# Patient Record
Sex: Female | Born: 1960 | Race: White | Hispanic: No
Health system: Southern US, Academic
[De-identification: ages and names within clinical notes are randomized; demographics above are authoritative.]

## PROBLEM LIST (undated history)

## (undated) ENCOUNTER — Ambulatory Visit

## (undated) ENCOUNTER — Ambulatory Visit: Payer: PRIVATE HEALTH INSURANCE | Attending: Internal Medicine | Primary: Internal Medicine

## (undated) ENCOUNTER — Encounter: Attending: Internal Medicine | Primary: Internal Medicine

## (undated) ENCOUNTER — Encounter

## (undated) ENCOUNTER — Ambulatory Visit: Payer: PRIVATE HEALTH INSURANCE | Attending: Adult Health | Primary: Adult Health

## (undated) ENCOUNTER — Telehealth

## (undated) ENCOUNTER — Ambulatory Visit
Attending: Pharmacist Clinician (PhC)/ Clinical Pharmacy Specialist | Primary: Pharmacist Clinician (PhC)/ Clinical Pharmacy Specialist

## (undated) ENCOUNTER — Ambulatory Visit: Payer: PRIVATE HEALTH INSURANCE

## (undated) ENCOUNTER — Ambulatory Visit: Attending: Addiction (Substance Use Disorder) | Primary: Addiction (Substance Use Disorder)

## (undated) ENCOUNTER — Ambulatory Visit: Attending: Internal Medicine | Primary: Internal Medicine

## (undated) ENCOUNTER — Ambulatory Visit: Attending: Pharmacist | Primary: Pharmacist

## (undated) ENCOUNTER — Telehealth: Attending: Internal Medicine | Primary: Internal Medicine

## (undated) ENCOUNTER — Ambulatory Visit: Payer: MEDICARE | Attending: Internal Medicine | Primary: Internal Medicine

## (undated) ENCOUNTER — Encounter: Payer: PRIVATE HEALTH INSURANCE | Attending: Retina Specialist | Primary: Retina Specialist

## (undated) ENCOUNTER — Ambulatory Visit: Payer: MEDICAID | Attending: Internal Medicine | Primary: Internal Medicine

## (undated) ENCOUNTER — Encounter: Attending: Adult Health | Primary: Adult Health

## (undated) ENCOUNTER — Telehealth: Attending: Clinical | Primary: Clinical

## (undated) ENCOUNTER — Encounter: Attending: Addiction (Substance Use Disorder) | Primary: Addiction (Substance Use Disorder)

## (undated) ENCOUNTER — Ambulatory Visit
Payer: PRIVATE HEALTH INSURANCE | Attending: Student in an Organized Health Care Education/Training Program | Primary: Student in an Organized Health Care Education/Training Program

## (undated) ENCOUNTER — Ambulatory Visit: Attending: Mental Health | Primary: Mental Health

## (undated) ENCOUNTER — Telehealth: Attending: Family | Primary: Family

## (undated) ENCOUNTER — Encounter
Attending: Student in an Organized Health Care Education/Training Program | Primary: Student in an Organized Health Care Education/Training Program

## (undated) ENCOUNTER — Encounter: Payer: PRIVATE HEALTH INSURANCE | Attending: Adult Health | Primary: Adult Health

## (undated) ENCOUNTER — Ambulatory Visit: Payer: MEDICAID

## (undated) ENCOUNTER — Other Ambulatory Visit: Attending: Addiction (Substance Use Disorder) | Primary: Addiction (Substance Use Disorder)

## (undated) ENCOUNTER — Ambulatory Visit: Payer: MEDICARE

## (undated) ENCOUNTER — Ambulatory Visit: Payer: BLUE CROSS/BLUE SHIELD | Attending: Internal Medicine | Primary: Internal Medicine

## (undated) DIAGNOSIS — I10 Essential (primary) hypertension: Secondary | ICD-10-CM

## (undated) DIAGNOSIS — K7460 Unspecified cirrhosis of liver: Secondary | ICD-10-CM

## (undated) DIAGNOSIS — E119 Type 2 diabetes mellitus without complications: Secondary | ICD-10-CM

## (undated) DIAGNOSIS — B182 Chronic viral hepatitis C: Secondary | ICD-10-CM

## (undated) DIAGNOSIS — I509 Heart failure, unspecified: Secondary | ICD-10-CM

## (undated) DIAGNOSIS — F419 Anxiety disorder, unspecified: Secondary | ICD-10-CM

## (undated) HISTORY — PX: HAND SURGERY: SHX662

## (undated) HISTORY — PX: APPENDECTOMY: SHX54
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
## 2007-06-27 ENCOUNTER — Emergency Department: Payer: Self-pay | Admitting: Emergency Medicine

## 2008-05-04 ENCOUNTER — Emergency Department: Payer: Self-pay | Admitting: Emergency Medicine

## 2009-01-05 ENCOUNTER — Inpatient Hospital Stay: Payer: Self-pay | Admitting: Internal Medicine

## 2009-03-29 ENCOUNTER — Emergency Department: Payer: Self-pay | Admitting: Emergency Medicine

## 2009-03-31 ENCOUNTER — Emergency Department: Payer: Self-pay | Admitting: Emergency Medicine

## 2009-04-02 ENCOUNTER — Emergency Department: Payer: Self-pay | Admitting: Unknown Physician Specialty

## 2009-05-10 ENCOUNTER — Emergency Department: Payer: Self-pay | Admitting: Emergency Medicine

## 2010-01-21 ENCOUNTER — Observation Stay: Payer: Self-pay | Admitting: General Surgery

## 2010-03-07 ENCOUNTER — Emergency Department: Payer: Self-pay | Admitting: Emergency Medicine

## 2010-04-02 ENCOUNTER — Emergency Department: Payer: Self-pay | Admitting: Emergency Medicine

## 2010-04-16 ENCOUNTER — Emergency Department: Payer: Self-pay | Admitting: Unknown Physician Specialty

## 2010-04-19 ENCOUNTER — Emergency Department: Payer: Self-pay | Admitting: Internal Medicine

## 2010-05-25 IMAGING — CR DG CHEST 2V
1 series · 2 of 2 positions shown · non-contrast
Comparison: none

REASON FOR EXAM: sob
COMMENTS:

[Series 1: view not recorded · 0.17mm/px · 2 of 2 slices shown]
[im 1/2]
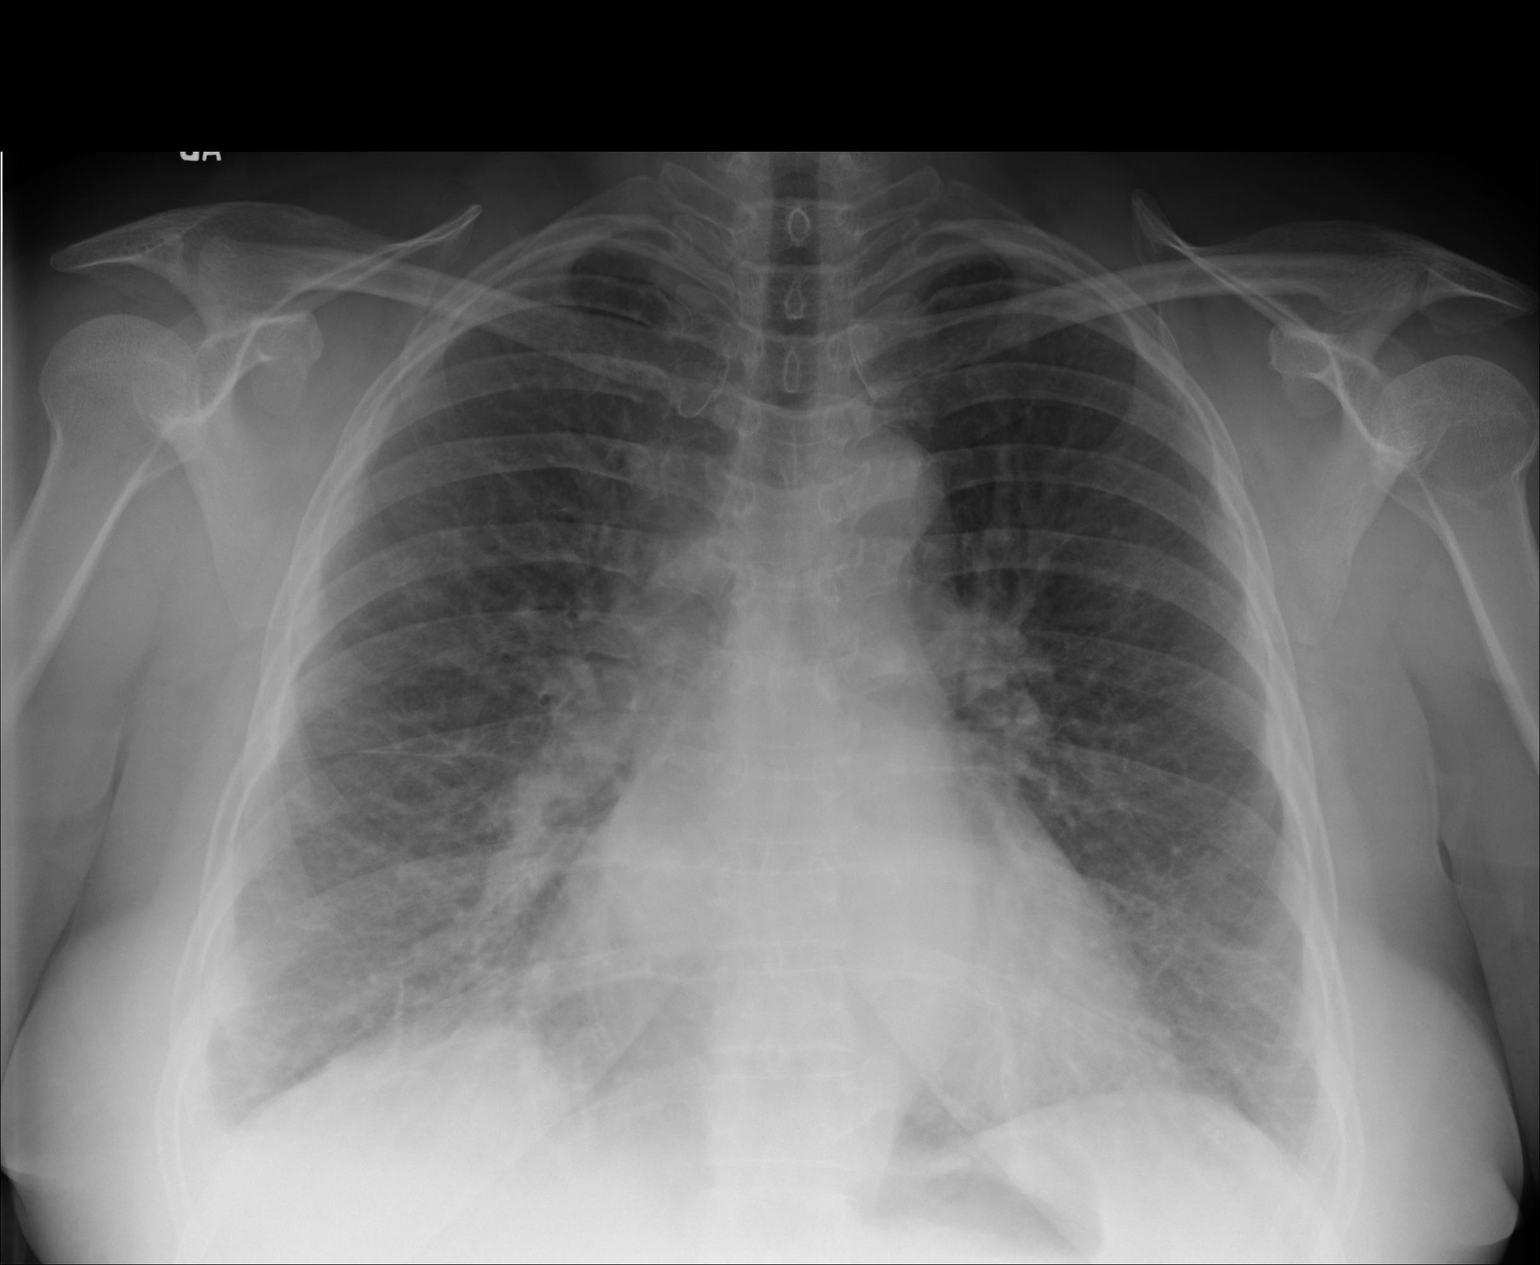
[im 2/2]
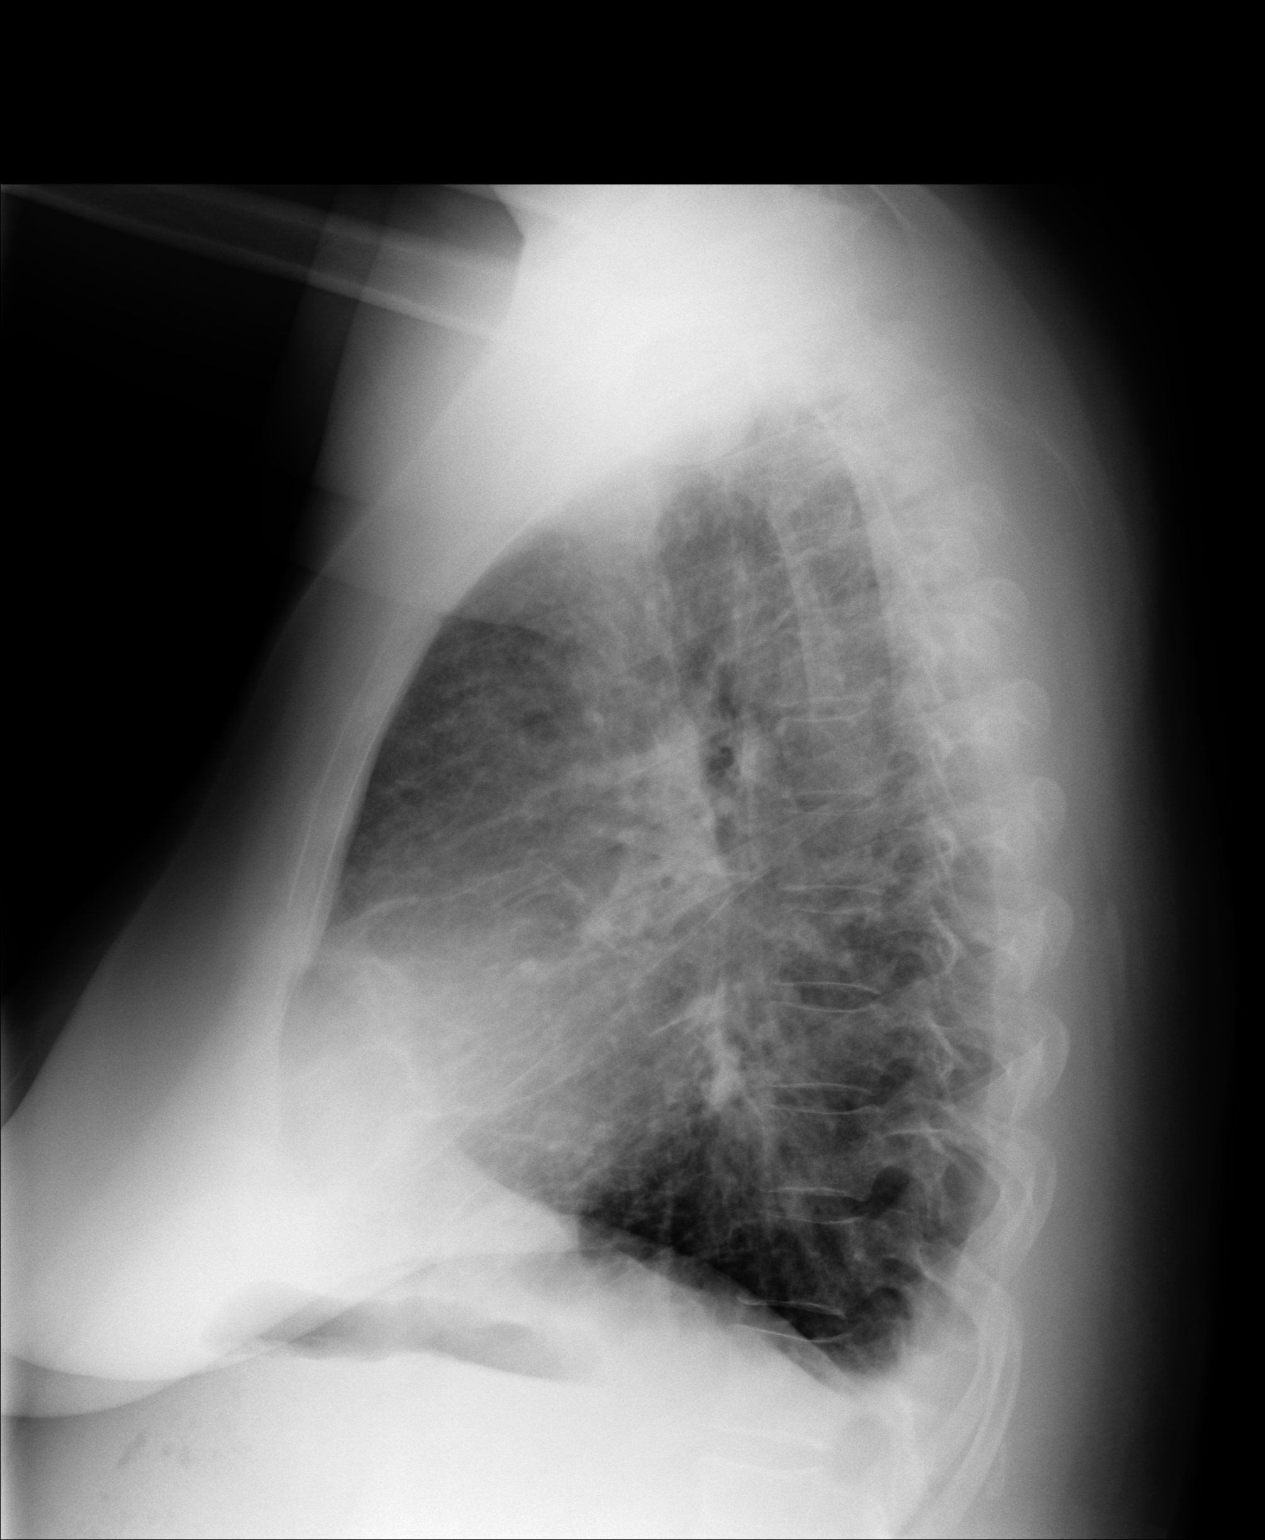

[2 of 2 positions shown; findings below may reference images not displayed]

PROCEDURE:     DXR - DXR CHEST PA (OR AP) AND LATERAL  - January 08, 2009  [DATE]

RESULT:     Comparison is made to the exam of 01/05/2009. There are trace
bilateral pleural effusions. The lungs are hyperinflated. There is pulmonary
vascular congestion. Mild pulmonary edema is not excluded. The heart is
mildly enlarged but stable.
IMPRESSION: COPD with mild cardiomegaly and trace bilateral pleural
effusions. Changes of pulmonary vascular congestion and either mild fibrosis
or mild pulmonary edema are noted.

## 2010-05-28 ENCOUNTER — Emergency Department: Payer: Self-pay | Admitting: Emergency Medicine

## 2010-07-18 ENCOUNTER — Emergency Department: Payer: Self-pay | Admitting: Emergency Medicine

## 2011-08-13 ENCOUNTER — Emergency Department: Payer: Self-pay | Admitting: Emergency Medicine

## 2011-08-17 IMAGING — CT CT HEAD WITHOUT CONTRAST
2 series · 16 of 30 positions shown, 20 images · non-contrast
Comparison: none

REASON FOR EXAM: headache
COMMENTS:

PROCEDURE:     CT  - CT HEAD WITHOUT CONTRAST  - April 02, 2010  [DATE]
RESULT:
HISTORY: Headache.
COMPARISON STUDY: No recent.
PROCEDURE AND FINDINGS: Standard nonenhanced Head CT was obtained. There is
no mass lesion. There is no hydrocephalus. No hemorrhage. No acute bony
abnormality.

[Series 2: without · axial · non-contrast · 0.41mm/px · z∈[-182,-62]mm · 13 of 29 slices shown, 17 images]
[im 3/29  brain]
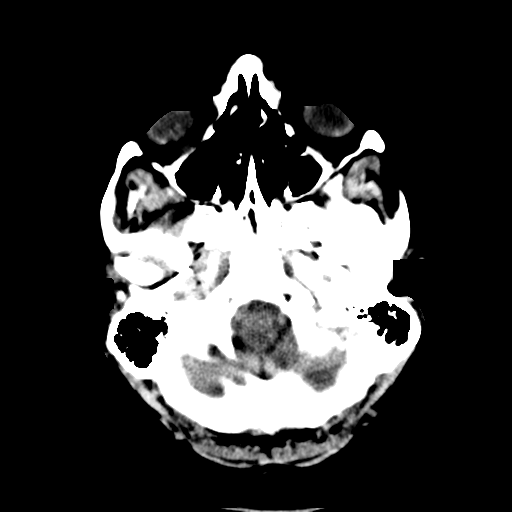
[im 3/29  bone]
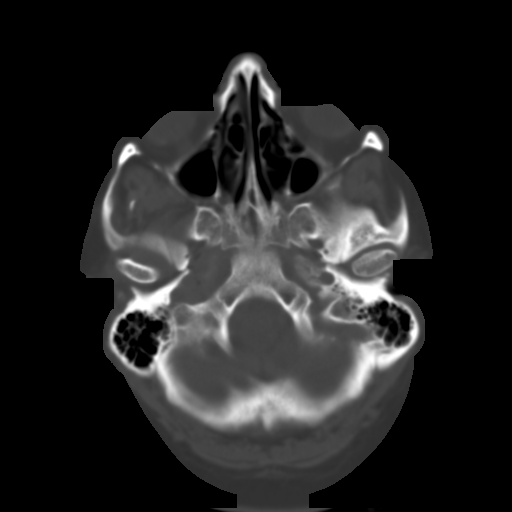
[im 5/29  brain]
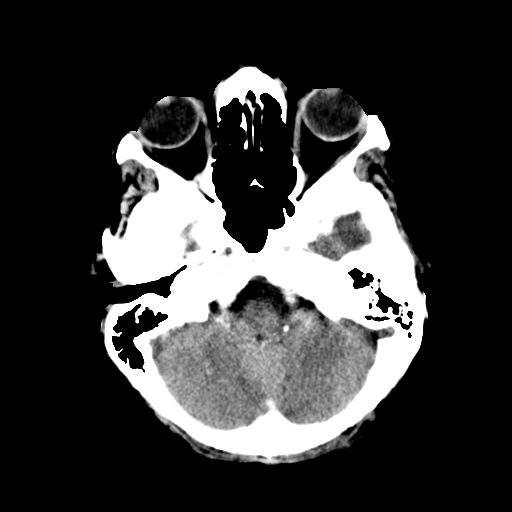
[im 7/29  brain]
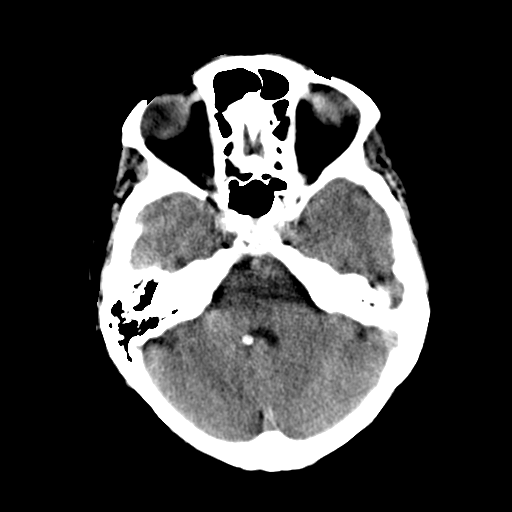
[im 9/29  brain]
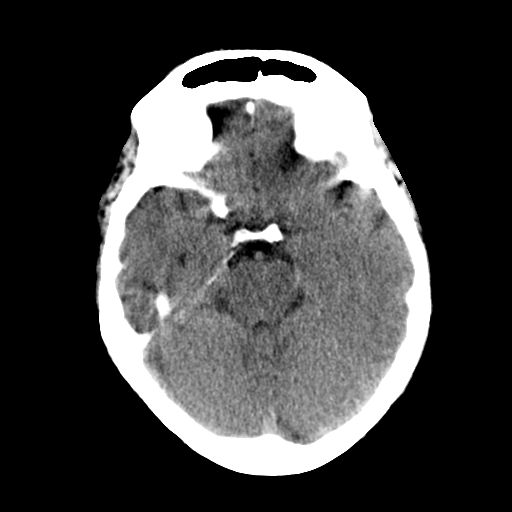
[im 11/29  brain]
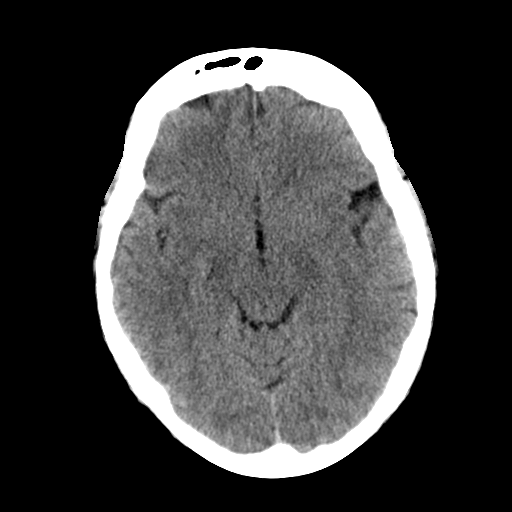
[im 11/29  bone]
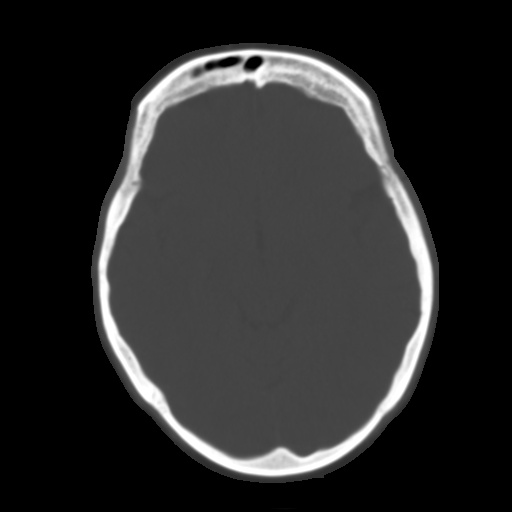
[im 13/29  brain]
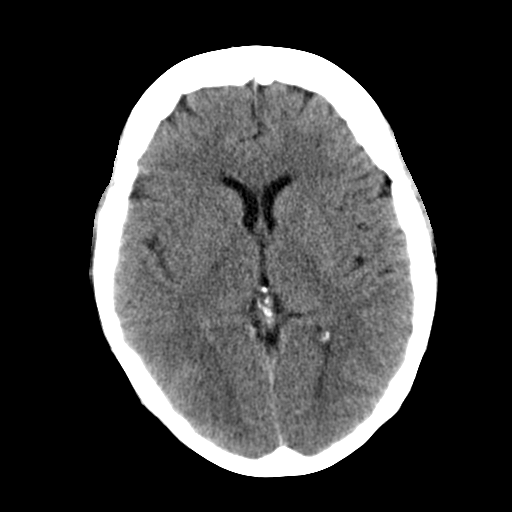
[im 15/29  brain]
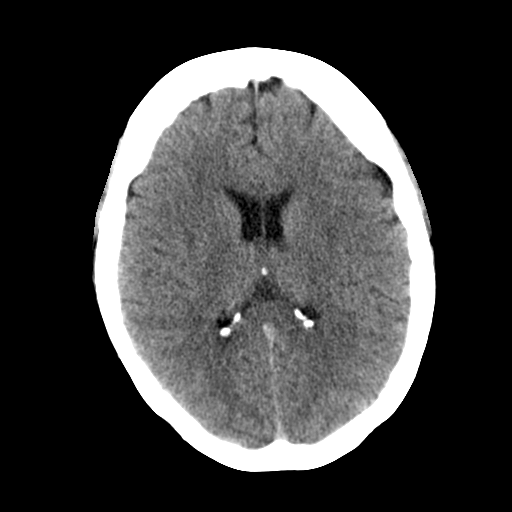
[im 17/29  brain]
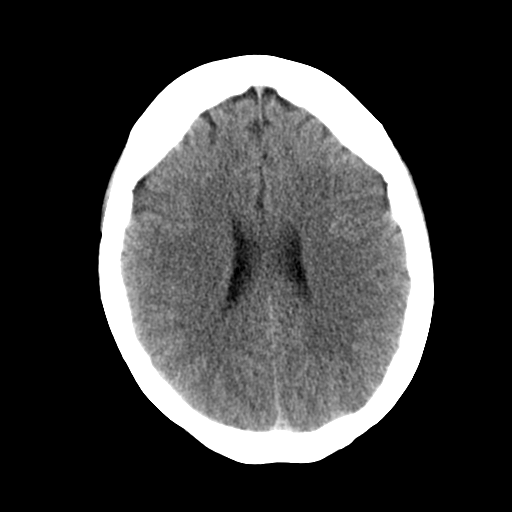
[im 19/29  brain]
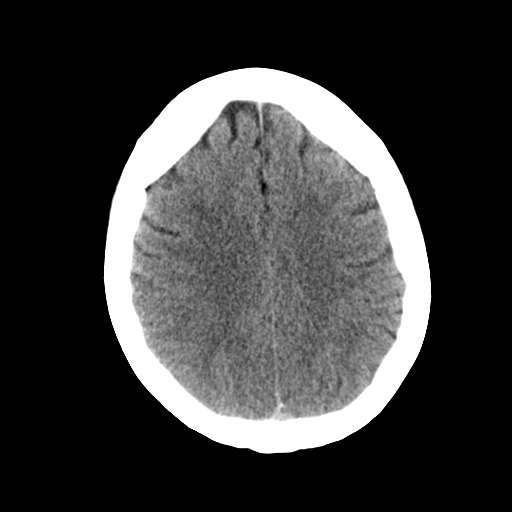
[im 19/29  bone]
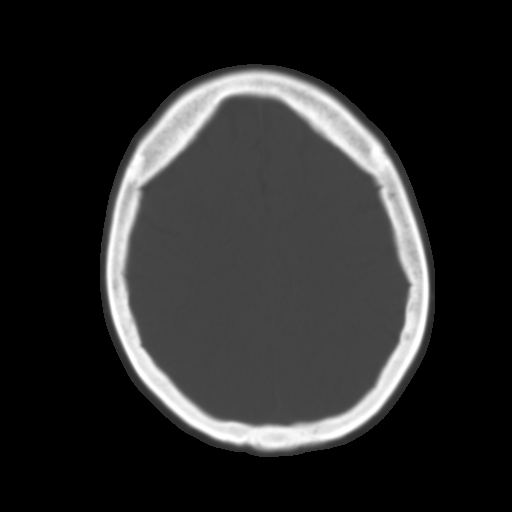
[im 21/29  brain]
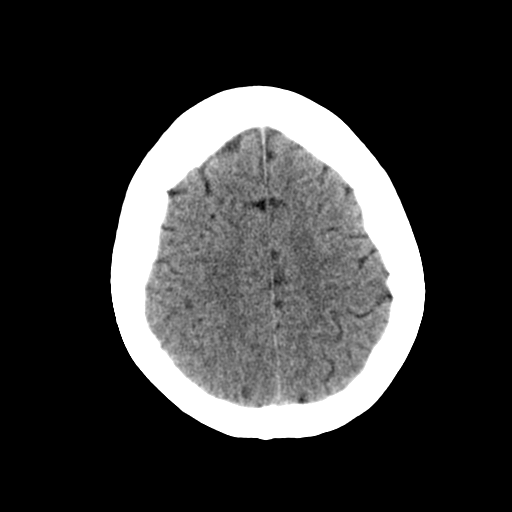
[im 23/29  brain]
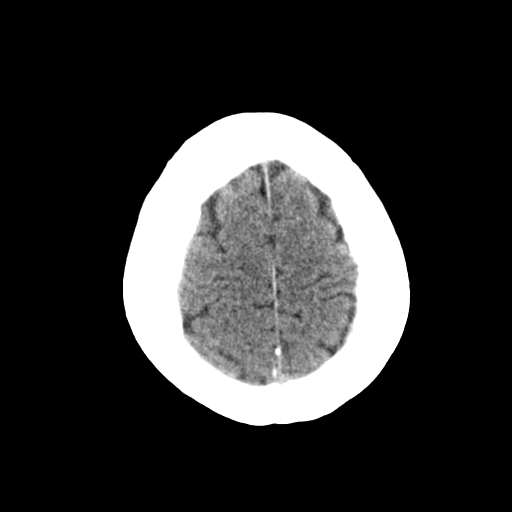
[im 25/29  brain]
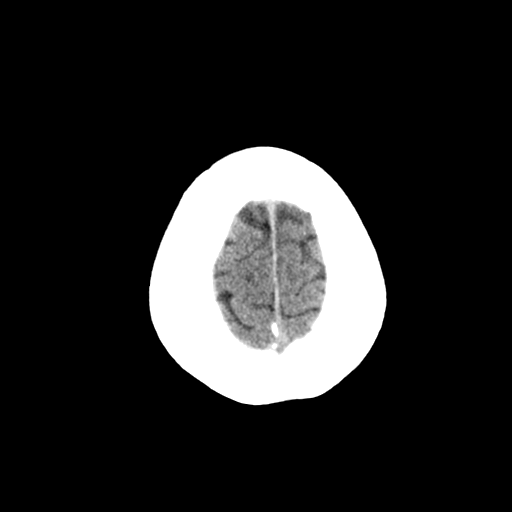
[im 27/29  brain]
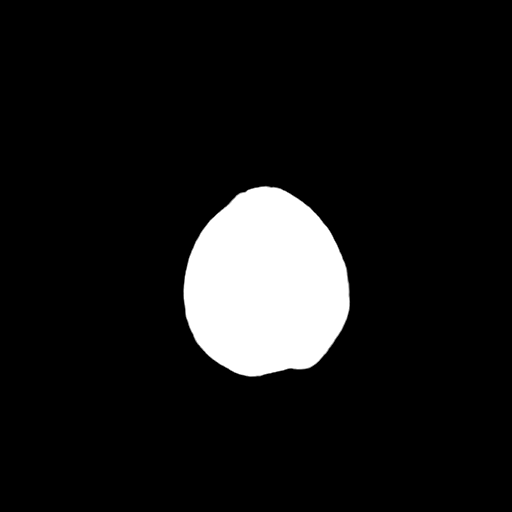
[im 27/29  bone]
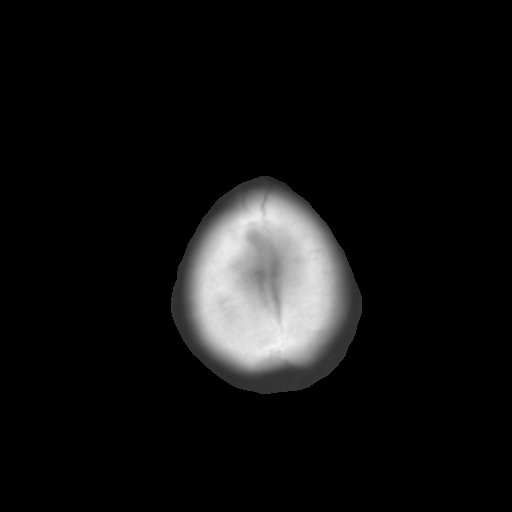

[Series 3: bone · axial · 0.41mm/px · z∈[-182,-142]mm · 3 of 29 slices shown]
[im 3/29  bone]
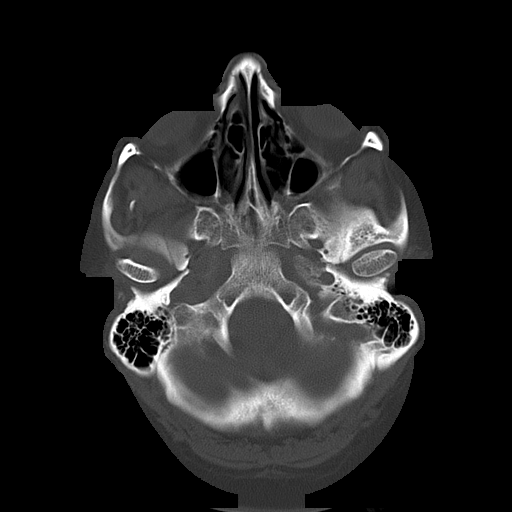
[im 7/29  bone]
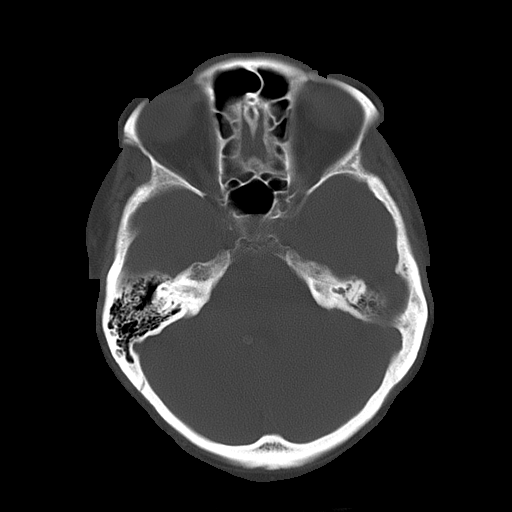
[im 11/29  bone]
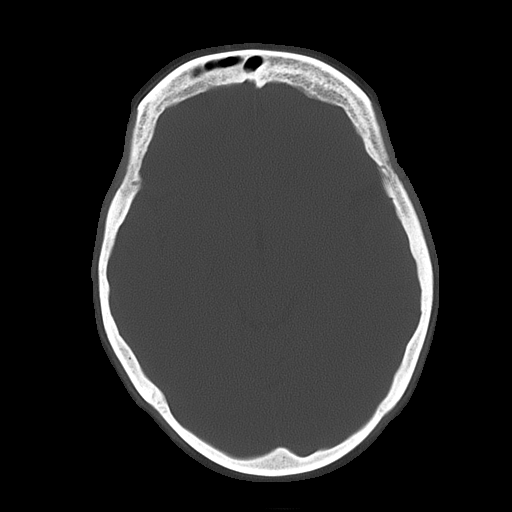

[16 of 30 positions shown; findings below may reference images not displayed]

IMPRESSION: No acute abnormality.

## 2015-10-02 ENCOUNTER — Emergency Department
Admission: EM | Admit: 2015-10-02 | Discharge: 2015-10-02 | Disposition: A | Discharge status: Home / Self Care | Payer: Self-pay | Attending: Emergency Medicine | Admitting: Emergency Medicine

## 2015-10-02 ENCOUNTER — Emergency Department: Payer: Self-pay

## 2015-10-02 ENCOUNTER — Encounter: Payer: Self-pay | Admitting: Emergency Medicine

## 2015-10-02 DIAGNOSIS — F17210 Nicotine dependence, cigarettes, uncomplicated: Secondary | ICD-10-CM | POA: Insufficient documentation

## 2015-10-02 DIAGNOSIS — E119 Type 2 diabetes mellitus without complications: Secondary | ICD-10-CM | POA: Insufficient documentation

## 2015-10-02 DIAGNOSIS — R1011 Right upper quadrant pain: Secondary | ICD-10-CM | POA: Insufficient documentation

## 2015-10-02 DIAGNOSIS — R1013 Epigastric pain: Secondary | ICD-10-CM | POA: Insufficient documentation

## 2015-10-02 DIAGNOSIS — R109 Unspecified abdominal pain: Secondary | ICD-10-CM

## 2015-10-02 HISTORY — DX: Type 2 diabetes mellitus without complications: E11.9

## 2015-10-02 HISTORY — DX: Chronic viral hepatitis C: B18.2

## 2015-10-02 HISTORY — DX: Unspecified cirrhosis of liver: K74.60

## 2015-10-02 LAB — COMPREHENSIVE METABOLIC PANEL
ALK PHOS: 78 U/L (ref 38–126)
ALT: 13 U/L — AB (ref 14–54)
AST: 17 U/L (ref 15–41)
Albumin: 3.8 g/dL (ref 3.5–5.0)
Anion gap: 8 (ref 5–15)
BILIRUBIN TOTAL: 1 mg/dL (ref 0.3–1.2)
BUN: 9 mg/dL (ref 6–20)
CHLORIDE: 103 mmol/L (ref 101–111)
CO2: 28 mmol/L (ref 22–32)
CREATININE: 0.62 mg/dL (ref 0.44–1.00)
Calcium: 9.4 mg/dL (ref 8.9–10.3)
GLUCOSE: 142 mg/dL — AB (ref 65–99)
POTASSIUM: 3.6 mmol/L (ref 3.5–5.1)
Sodium: 139 mmol/L (ref 135–145)
Total Protein: 7.2 g/dL (ref 6.5–8.1)

## 2015-10-02 LAB — CBC
HEMATOCRIT: 37.8 % (ref 35.0–47.0)
Hemoglobin: 12.9 g/dL (ref 12.0–16.0)
MCH: 29.7 pg (ref 26.0–34.0)
MCHC: 34.2 g/dL (ref 32.0–36.0)
MCV: 86.9 fL (ref 80.0–100.0)
PLATELETS: 142 10*3/uL — AB (ref 150–440)
RBC: 4.35 MIL/uL (ref 3.80–5.20)
RDW: 12.7 % (ref 11.5–14.5)
WBC: 7 10*3/uL (ref 3.6–11.0)

## 2015-10-02 LAB — URINALYSIS COMPLETE WITH MICROSCOPIC (ARMC ONLY)
BACTERIA UA: NONE SEEN
BILIRUBIN URINE: NEGATIVE
GLUCOSE, UA: NEGATIVE mg/dL
HGB URINE DIPSTICK: NEGATIVE
Ketones, ur: NEGATIVE mg/dL
Nitrite: NEGATIVE
Protein, ur: NEGATIVE mg/dL
Specific Gravity, Urine: 1.008 (ref 1.005–1.030)
pH: 6 (ref 5.0–8.0)

## 2015-10-02 LAB — LIPASE, BLOOD: LIPASE: 19 U/L (ref 11–51)

## 2015-10-02 MED ORDER — ETODOLAC 200 MG PO CAPS: 200.0000 mg | ORAL_CAPSULE | Freq: Three times a day (TID) | ORAL | Status: AC

## 2015-10-02 MED ORDER — ONDANSETRON HCL 4 MG/2ML IJ SOLN
4.0000 mg | Freq: Once | INTRAMUSCULAR | Status: AC
  Administered 2015-10-02: 4 mg via INTRAVENOUS
  Filled 2015-10-02: qty 2

## 2015-10-02 MED ORDER — MORPHINE SULFATE (PF) 4 MG/ML IV SOLN
4.0000 mg | Freq: Once | INTRAVENOUS | Status: AC
  Administered 2015-10-02: 4 mg via INTRAVENOUS
  Filled 2015-10-02: qty 1

## 2015-10-02 MED ORDER — DICYCLOMINE HCL 10 MG PO CAPS: 10.0000 mg | ORAL_CAPSULE | Freq: Three times a day (TID) | ORAL | Status: AC

## 2015-10-02 MED ORDER — SODIUM CHLORIDE 0.9 % IV BOLUS (SEPSIS)
1000.0000 mL | Freq: Once | INTRAVENOUS | Status: AC
  Administered 2015-10-02: 1000 mL via INTRAVENOUS

## 2015-10-02 MED ORDER — DICYCLOMINE HCL 10 MG PO CAPS
10.0000 mg | ORAL_CAPSULE | Freq: Once | ORAL | Status: AC
  Administered 2015-10-02: 10 mg via ORAL
  Filled 2015-10-02: qty 1

## 2015-10-02 MED ORDER — GI COCKTAIL ~~LOC~~
30.0000 mL | Freq: Once | ORAL | Status: AC
  Administered 2015-10-02: 30 mL via ORAL
  Filled 2015-10-02: qty 30

## 2015-10-02 NOTE — ED Notes (Signed)
Pt reports diffuse lower abdominal pain since Saturday. Denies N/V/D Pt in no acute distress.

## 2015-10-02 NOTE — ED Notes (Signed)
Pt in bed with eyes closed, no acute distress noted

## 2015-10-02 NOTE — ED Notes (Signed)
Report received 

## 2015-10-02 NOTE — ED Provider Notes (Signed)
[facility] Emergency Department Provider Note  ____________________________________________  Time seen: Approximately 449 AM  I have reviewed the triage vital signs and the nursing notes.   HISTORY  Chief Complaint Abdominal Pain    HPI DAJANEE Braun is a 55 y.o. female who comes into the hospital today with abdominal pain. The patient reports that the pain started Saturday. She thought she was constipated so she took a laxative. She reports that she had a bowel movement yesterday morning and the pain seemed to ease off but never went away. She reports though that during the day the pain continued to get worse and worse. She took a pain pill that was given to her by a friend but has not helped at all. The patient is also continue to take Tums but her pain has gotten worse and worse.Patient rates her pain a 7 out of 10 in intensity. She reports that hurts across her abdomen. She denies pain with urination denies nausea and vomiting. She reports that she did vomit once due to pain but not anymore. The patient reports that she is unable to sleep and is unsure what going on so she decided to come in for evaluation and treatment. The patient is in my head. (The past.   Past Medical History  Diagnosis Date  . Diabetes mellitus without complication (HCC)   . Cirrhosis (HCC)   . Hep C w/ coma, chronic (HCC)     There are no active problems to display for this patient.   Past Surgical History  Procedure Laterality Date  . Hand surgery Right   . Appendectomy      No current outpatient prescriptions on file.  Allergies Review of patient's allergies indicates no known allergies.  History reviewed. No pertinent family history.  Social History Social History  Substance Use Topics  . Smoking status: Current Every Day Smoker -- 0.50 packs/day    Types: Cigarettes  . Smokeless tobacco: None  . Alcohol Use: No    Review of Systems Constitutional: No  fever/chills Eyes: No visual changes. ENT: No sore throat. Cardiovascular: Denies chest pain. Respiratory: Denies shortness of breath. Gastrointestinal:  abdominal pain.  No nausea, no vomiting.  No diarrhea.  No constipation. Genitourinary: Negative for dysuria. Musculoskeletal: Negative for back pain. Skin: Negative for rash. Neurological: Negative for headaches, focal weakness or numbness.  10-point ROS otherwise negative.  ____________________________________________   PHYSICAL EXAM:  VITAL SIGNS: ED Triage Vitals  Enc Vitals Group     BP 10/02/15 0154 162/95 mmHg     Pulse Rate 10/02/15 0154 83     Resp 10/02/15 0154 18     Temp 10/02/15 0154 97.8 F (36.6 C)     Temp Source 10/02/15 0154 Oral     SpO2 10/02/15 0154 99 %     Weight 10/02/15 0154 160 lb (72.576 kg)     Height 10/02/15 0154  (1.6 m)     Head Cir --      Peak Flow --      Pain Score 10/02/15 0155 7     Pain Loc --      Pain Edu? --      Excl. in GC? --     Constitutional: Alert and oriented. Well appearing and in moderate distress. Eyes: Conjunctivae are normal. PERRL. EOMI. Head: Atraumatic. Nose: No congestion/rhinnorhea. Mouth/Throat: Mucous membranes are moist.  Oropharynx non-erythematous. Cardiovascular: Normal rate, regular rhythm. Grossly normal heart sounds.  Good peripheral circulation. Respiratory: Normal  respiratory effort.  No retractions. Lungs CTAB. Gastrointestinal: Soft with epigastric and right upper quadrant tenderness to palpation. No distention. Positive bowel sounds Musculoskeletal: No lower extremity tenderness nor edema.   Neurologic:  Normal speech and language.  Skin:  Skin is warm, dry and intact.  Psychiatric: Mood and affect are normal.   ____________________________________________   LABS (all labs ordered are listed, but only abnormal results are displayed)  Labs Reviewed  COMPREHENSIVE METABOLIC PANEL - Abnormal; Notable for the following:    Glucose,  Bld 142 (*)    ALT 13 (*)    All other components within normal limits  CBC - Abnormal; Notable for the following:    Platelets 142 (*)    All other components within normal limits  URINALYSIS COMPLETEWITH MICROSCOPIC ([facility] ONLY) - Abnormal; Notable for the following:    Color, Urine YELLOW (*)    APPearance CLEAR (*)    Leukocytes, UA 2+ (*)    Squamous Epithelial / LPF 6-30 (*)    All other components within normal limits  LIPASE, BLOOD   ____________________________________________  EKG  none ____________________________________________  RADIOLOGY  [place] abd: Negative right upper quadrant ultrasound, no explanation for pain. ____________________________________________   PROCEDURES  Procedure(s) performed: None  Critical Care performed: No  ____________________________________________   INITIAL IMPRESSION / ASSESSMENT AND PLAN / ED COURSE  Pertinent labs & imaging results that were available during my care of the patient were reviewed by me and considered in my medical decision making (see chart for details).  This is a 55 year old female who comes into the hospital today with right upper quadrant and epigastric abdominal pain. The patient did have some blood work so far that did not show an elevated lipase or white blood cell count but I will send the patient for an ultrasound to evaluate her gallbladder. The patient reports that when he feel her lower abdomen she just feels pressure. I'll reassess the patient once I received the results of her ultrasound. The patient receive a dose of normal saline as well as a dose of morphine and Zofran for her pain.  I went and spoke with the patient and she seemed to be now complaining of pain again in her lower abdomen. I offered the patient some medication or we could perform a CT scan to see if there was something else going on. The patient reports she has had her appendix out so this is unlikely to be appendicitis. The patient  reports that she wanted to try medicine and no more imaging. I will give the patient a dose of GI cocktail and Bentyl and discharge the patient to home. She reports that if anything is worse she would return for further evaluation. ____________________________________________   FINAL CLINICAL IMPRESSION(S) / ED DIAGNOSES  Final diagnoses:  Abdominal pain      Rebecka Apley, MD 10/02/15 [phone]

## 2015-10-02 NOTE — ED Notes (Signed)
Pt /co low abd pain since Saturday; pain radiates across entire lower abd; denies urinary s/s; took laxative Saturday and had a bowel movement Sunday morning; pt says history of constipation; denies nausea and vomitng

## 2015-10-02 NOTE — Discharge Instructions (Signed)

## 2016-04-27 ENCOUNTER — Inpatient Hospital Stay
Admission: EM | Admit: 2016-04-27 | Discharge: 2016-04-29 | DRG: 282 | Disposition: A | Discharge status: Home / Self Care | Payer: Self-pay | Attending: Internal Medicine | Admitting: Internal Medicine

## 2016-04-27 ENCOUNTER — Emergency Department: Payer: Self-pay

## 2016-04-27 ENCOUNTER — Encounter: Payer: Self-pay | Admitting: Emergency Medicine

## 2016-04-27 DIAGNOSIS — I214 Non-ST elevation (NSTEMI) myocardial infarction: Principal | ICD-10-CM | POA: Diagnosis present

## 2016-04-27 DIAGNOSIS — I219 Acute myocardial infarction, unspecified: Secondary | ICD-10-CM | POA: Diagnosis present

## 2016-04-27 DIAGNOSIS — B182 Chronic viral hepatitis C: Secondary | ICD-10-CM | POA: Diagnosis present

## 2016-04-27 DIAGNOSIS — F17210 Nicotine dependence, cigarettes, uncomplicated: Secondary | ICD-10-CM | POA: Diagnosis present

## 2016-04-27 DIAGNOSIS — I2510 Atherosclerotic heart disease of native coronary artery without angina pectoris: Secondary | ICD-10-CM | POA: Diagnosis present

## 2016-04-27 DIAGNOSIS — F1490 Cocaine use, unspecified, uncomplicated: Secondary | ICD-10-CM | POA: Diagnosis present

## 2016-04-27 DIAGNOSIS — Z7984 Long term (current) use of oral hypoglycemic drugs: Secondary | ICD-10-CM

## 2016-04-27 DIAGNOSIS — R001 Bradycardia, unspecified: Secondary | ICD-10-CM | POA: Diagnosis present

## 2016-04-27 DIAGNOSIS — Z79899 Other long term (current) drug therapy: Secondary | ICD-10-CM

## 2016-04-27 DIAGNOSIS — R079 Chest pain, unspecified: Secondary | ICD-10-CM | POA: Diagnosis present

## 2016-04-27 DIAGNOSIS — Z8249 Family history of ischemic heart disease and other diseases of the circulatory system: Secondary | ICD-10-CM

## 2016-04-27 DIAGNOSIS — F1190 Opioid use, unspecified, uncomplicated: Secondary | ICD-10-CM | POA: Diagnosis present

## 2016-04-27 DIAGNOSIS — Z833 Family history of diabetes mellitus: Secondary | ICD-10-CM

## 2016-04-27 DIAGNOSIS — I10 Essential (primary) hypertension: Secondary | ICD-10-CM | POA: Diagnosis present

## 2016-04-27 DIAGNOSIS — I2582 Chronic total occlusion of coronary artery: Secondary | ICD-10-CM | POA: Diagnosis present

## 2016-04-27 DIAGNOSIS — E119 Type 2 diabetes mellitus without complications: Secondary | ICD-10-CM | POA: Diagnosis present

## 2016-04-27 HISTORY — DX: Essential (primary) hypertension: I10

## 2016-04-27 HISTORY — DX: Heart failure, unspecified: I50.9

## 2016-04-27 HISTORY — DX: Anxiety disorder, unspecified: F41.9

## 2016-04-27 LAB — URINE DRUG SCREEN, QUALITATIVE (ARMC ONLY)
AMPHETAMINES, UR SCREEN: NOT DETECTED
Barbiturates, Ur Screen: NOT DETECTED
Benzodiazepine, Ur Scrn: POSITIVE — AB
COCAINE METABOLITE, UR ~~LOC~~: POSITIVE — AB
Cannabinoid 50 Ng, Ur ~~LOC~~: NOT DETECTED
MDMA (Ecstasy)Ur Screen: NOT DETECTED
METHADONE SCREEN, URINE: POSITIVE — AB
OPIATE, UR SCREEN: NOT DETECTED
Phencyclidine (PCP) Ur S: NOT DETECTED
Tricyclic, Ur Screen: POSITIVE — AB

## 2016-04-27 LAB — CBC
HEMATOCRIT: 37.4 % (ref 35.0–47.0)
Hemoglobin: 13.2 g/dL (ref 12.0–16.0)
MCH: 30.9 pg (ref 26.0–34.0)
MCHC: 35.3 g/dL (ref 32.0–36.0)
MCV: 87.6 fL (ref 80.0–100.0)
Platelets: 167 10*3/uL (ref 150–440)
RBC: 4.27 MIL/uL (ref 3.80–5.20)
RDW: 13.1 % (ref 11.5–14.5)
WBC: 5.9 10*3/uL (ref 3.6–11.0)

## 2016-04-27 LAB — BASIC METABOLIC PANEL
Anion gap: 7 (ref 5–15)
BUN: 16 mg/dL (ref 6–20)
CHLORIDE: 106 mmol/L (ref 101–111)
CO2: 23 mmol/L (ref 22–32)
Calcium: 8.9 mg/dL (ref 8.9–10.3)
Creatinine, Ser: 0.89 mg/dL (ref 0.44–1.00)
GFR calc non Af Amer: >60 mL/min (ref >60)
Glucose, Bld: 182 mg/dL — ABNORMAL HIGH (ref 65–99)
POTASSIUM: 4.1 mmol/L (ref 3.5–5.1)
SODIUM: 136 mmol/L (ref 135–145)

## 2016-04-27 LAB — BRAIN NATRIURETIC PEPTIDE: B Natriuretic Peptide: 41 pg/mL (ref 0.0–100.0)

## 2016-04-27 LAB — TROPONIN I: Troponin I: <0.03 ng/mL (ref <0.03)

## 2016-04-27 LAB — GLUCOSE, CAPILLARY: Glucose-Capillary: 147 mg/dL — ABNORMAL HIGH (ref 65–99)

## 2016-04-27 MED ORDER — ASPIRIN EC 325 MG PO TBEC
325.0000 mg | DELAYED_RELEASE_TABLET | Freq: Every day | ORAL | Status: DC
  Administered 2016-04-27 – 2016-04-28 (×2): 325 mg via ORAL
  Filled 2016-04-27 (×2): qty 1

## 2016-04-27 MED ORDER — ETODOLAC 200 MG PO CAPS
200.0000 mg | ORAL_CAPSULE | Freq: Three times a day (TID) | ORAL | Status: DC
  Administered 2016-04-27 – 2016-04-29 (×6): 200 mg via ORAL
  Filled 2016-04-27 (×8): qty 1

## 2016-04-27 MED ORDER — NITROGLYCERIN 0.4 MG SL SUBL
0.4000 mg | SUBLINGUAL_TABLET | SUBLINGUAL | Status: DC | PRN
  Administered 2016-04-27 (×2): 0.4 mg via SUBLINGUAL
  Filled 2016-04-27: qty 3

## 2016-04-27 MED ORDER — PNEUMOCOCCAL VAC POLYVALENT 25 MCG/0.5ML IJ INJ
0.5000 mL | INJECTION | INTRAMUSCULAR | Status: AC
  Administered 2016-04-28: 0.5 mL via INTRAMUSCULAR
  Filled 2016-04-27: qty 0.5

## 2016-04-27 MED ORDER — LISINOPRIL 10 MG PO TABS
10.0000 mg | ORAL_TABLET | Freq: Every day | ORAL | Status: DC
  Administered 2016-04-27 – 2016-04-28 (×2): 10 mg via ORAL
  Filled 2016-04-27 (×2): qty 1

## 2016-04-27 MED ORDER — GI COCKTAIL ~~LOC~~
30.0000 mL | Freq: Four times a day (QID) | ORAL | Status: DC | PRN
  Administered 2016-04-27: 30 mL via ORAL
  Filled 2016-04-27: qty 30

## 2016-04-27 MED ORDER — PRAVASTATIN SODIUM 10 MG PO TABS
10.0000 mg | ORAL_TABLET | Freq: Every day | ORAL | Status: DC
Start: 2016-04-28 — End: 2016-04-27

## 2016-04-27 MED ORDER — INSULIN ASPART 100 UNIT/ML ~~LOC~~ SOLN: 0.0000 [IU] | Freq: Every day | SUBCUTANEOUS | Status: DC

## 2016-04-27 MED ORDER — ENOXAPARIN SODIUM 40 MG/0.4ML ~~LOC~~ SOLN
40.0000 mg | SUBCUTANEOUS | Status: DC
  Administered 2016-04-27: 40 mg via SUBCUTANEOUS
  Filled 2016-04-27: qty 0.4

## 2016-04-27 MED ORDER — SODIUM CHLORIDE 0.9 % IV BOLUS (SEPSIS)
500.0000 mL | Freq: Once | INTRAVENOUS | Status: AC
  Administered 2016-04-27: 500 mL via INTRAVENOUS

## 2016-04-27 MED ORDER — DICYCLOMINE HCL 10 MG PO CAPS
10.0000 mg | ORAL_CAPSULE | Freq: Three times a day (TID) | ORAL | Status: DC
  Administered 2016-04-28 – 2016-04-29 (×4): 10 mg via ORAL
  Filled 2016-04-27 (×4): qty 1

## 2016-04-27 MED ORDER — OMEGA-3-ACID ETHYL ESTERS 1 G PO CAPS
1.0000 g | ORAL_CAPSULE | Freq: Two times a day (BID) | ORAL | Status: DC
  Administered 2016-04-27 – 2016-04-28 (×3): 1 g via ORAL
  Filled 2016-04-27 (×3): qty 1

## 2016-04-27 MED ORDER — HYDRALAZINE HCL 20 MG/ML IJ SOLN
5.0000 mg | Freq: Four times a day (QID) | INTRAMUSCULAR | Status: DC | PRN
  Administered 2016-04-27: 5 mg via INTRAVENOUS
  Filled 2016-04-27: qty 1

## 2016-04-27 MED ORDER — ZOLPIDEM TARTRATE 5 MG PO TABS
5.0000 mg | ORAL_TABLET | Freq: Every evening | ORAL | Status: DC | PRN
Start: 2016-04-27 — End: 2016-04-29
  Administered 2016-04-28: 5 mg via ORAL
  Filled 2016-04-27: qty 1

## 2016-04-27 MED ORDER — INSULIN ASPART 100 UNIT/ML ~~LOC~~ SOLN
0.0000 [IU] | Freq: Three times a day (TID) | SUBCUTANEOUS | Status: DC
Start: 2016-04-28 — End: 2016-04-29
  Administered 2016-04-29: 1 [IU] via SUBCUTANEOUS
  Filled 2016-04-27: qty 1

## 2016-04-27 MED ORDER — ACETAMINOPHEN 325 MG PO TABS
650.0000 mg | ORAL_TABLET | ORAL | Status: DC | PRN
  Administered 2016-04-28 – 2016-04-29 (×3): 650 mg via ORAL
  Filled 2016-04-27 (×3): qty 2

## 2016-04-27 MED ORDER — ASPIRIN 81 MG PO CHEW
324.0000 mg | CHEWABLE_TABLET | Freq: Once | ORAL | Status: AC
  Administered 2016-04-27: 324 mg via ORAL
  Filled 2016-04-27: qty 4

## 2016-04-27 MED ORDER — MORPHINE SULFATE (PF) 2 MG/ML IV SOLN: 2.0000 mg | INTRAVENOUS | Status: DC | PRN

## 2016-04-27 MED ORDER — ONDANSETRON HCL 4 MG/2ML IJ SOLN
4.0000 mg | Freq: Four times a day (QID) | INTRAMUSCULAR | Status: DC | PRN
  Administered 2016-04-28: 4 mg via INTRAVENOUS
  Filled 2016-04-27: qty 2

## 2016-04-27 NOTE — Progress Notes (Signed)
Pt ordered to have cardiology consult. Unit nurse secretary Liborio Nixon) aware, and will make sure it is entered/called in.

## 2016-04-27 NOTE — Progress Notes (Signed)
While completing admission screenings pt admitted to not having a PCP or health insurance. Because of this she is unable to see a doctor or afford her medication. Order to social work and case management has been added to help pt with these barrier to her care.

## 2016-04-27 NOTE — ED Triage Notes (Addendum)
Per [facility], patient comes from home with c/o CP that began 1 hour PTA. Yesterday patient experienced the same but it went away on its own. Patient also c/o tingling down left arm. Hx of anxiety, CHF, diabetes and cirrhosis of the liver. Patient does not take any medication because she does not have health insurance. With the CP patient also is experiencing SOB and nausea. EMS gave 4 of Zofran and 3 nitro sprays. Patient reports feeling better after nitro. Patient is A&O x4. SB on monitor rate of 55.

## 2016-04-27 NOTE — H&P (Signed)
[facility] [place] at [facility]   PATIENT NAME: Stephanie Braun    MR#:  [id number]  DATE OF BIRTH:  [birth year]  DATE OF ADMISSION:  04/27/2016  PRIMARY CARE PHYSICIAN: No PCP Per Patient   REQUESTING/REFERRING PHYSICIAN: Nita Sickle, MD  CHIEF COMPLAINT:  CHEST PAIN  HISTORY OF PRESENT ILLNESS:  Stephanie Braun  is a 55 y.o. female with a known history of Congestive heart failure, not on diuretics, cirrhosis due to hepatitis C, diabetes mellitus, essential hypertension, polysubstance abuse is presenting to the ED with a chief complaint of chest pain. Patient reports while she was smoking at around 2 PM she developed left-sided chest pain, feeling tight in chest radiating to the left shoulder, 10 out of 10, associated with nausea, shortness of breath, dizziness but denies loss of consciousness. EMS gave her 3 sublingual nitroglycerin sprays which brought her pain down to 5 out of 10. Initial troponin is negative. EKG with no acute changes. Patient endorses using methadone 2 days ago. Denies having any stress test or cardiac catheterization in the past  PAST MEDICAL HISTORY:   Past Medical History:  Diagnosis Date  . Anxiety   . CHF (congestive heart failure) (HCC)   . Cirrhosis (HCC)   . Diabetes mellitus without complication (HCC)   . Hep C w/ coma, chronic (HCC)   . Hypertension     PAST SURGICAL HISTOIRY:   Past Surgical History:  Procedure Laterality Date  . APPENDECTOMY    . HAND SURGERY Right     SOCIAL HISTORY:   Social History  Substance Use Topics  . Smoking status: Current Every Day Smoker    Packs/day: 0.50    Types: Cigarettes  . Smokeless tobacco: Not on file  . Alcohol use No    FAMILY HISTORY:  No family history on file. Hypertension, diabetes mellitus runs in her family DRUG ALLERGIES:   Allergies  Allergen Reactions  . Atorvastatin Other (See Comments)    Patient states this medication makes her chest hurt.     REVIEW OF SYSTEMS:  CONSTITUTIONAL: No fever, fatigue or weakness.  EYES: No blurred or double vision.  EARS, NOSE, AND THROAT: No tinnitus or ear pain.  RESPIRATORY: No cough, shortness of breath, wheezing or hemoptysis.  CARDIOVASCULAR: No chest pain, orthopnea, edema.  GASTROINTESTINAL: No nausea, vomiting, diarrhea or abdominal pain.  GENITOURINARY: No dysuria, hematuria.  ENDOCRINE: No polyuria, nocturia,  HEMATOLOGY: No anemia, easy bruising or bleeding SKIN: No rash or lesion. MUSCULOSKELETAL: No joint pain or arthritis.   NEUROLOGIC: No tingling, numbness, weakness.  PSYCHIATRY: No anxiety or depression.   MEDICATIONS AT HOME:   Prior to Admission medications   Medication Sig Start Date End Date Taking? Authorizing Provider  dicyclomine (BENTYL) 10 MG capsule Take 1 capsule (10 mg total) by mouth 3 (three) times daily before meals. 10/02/15 10/16/15  Rebecka Apley, MD  etodolac (LODINE) 200 MG capsule Take 1 capsule (200 mg total) by mouth every 8 (eight) hours. 10/02/15   Rebecka Apley, MD  lisinopril (PRINIVIL,ZESTRIL) 10 MG tablet Take 10 mg by mouth daily.  02/11/14   Historical Provider, MD  metFORMIN (GLUCOPHAGE) 500 MG tablet Take 500 mg by mouth 2 (two) times daily with a meal.  02/11/14   Historical Provider, MD      VITAL SIGNS:  Blood pressure (!) 153/86, pulse (!) 55, temperature 97.5 F (36.4 C), temperature source Oral, resp. rate 17, height 5\' 3"  (1.6 m), weight 72.6 kg (160  lb), SpO2 97 %.  PHYSICAL EXAMINATION:  GENERAL:  55 y.o.-year-old patient lying in the bed with no acute distress.  EYES: Pupils equal, round, reactive to light and accommodation. No scleral icterus. Extraocular muscles intact.  HEENT: Head atraumatic, normocephalic. Oropharynx and nasopharynx clear.  NECK:  Supple, no jugular venous distention. No thyroid enlargement, no tenderness.  LUNGS: Normal breath sounds bilaterally, no wheezing, rales,rhonchi or crepitation. No use of  accessory muscles of respiration.  CARDIOVASCULAR: S1, S2 normal. No murmurs, rubs, or gallops. No reproducible anterior chest wall tenderness on palpation ABDOMEN: Soft, nontender, nondistended. Bowel sounds present. No organomegaly or mass.  EXTREMITIES: No pedal edema, cyanosis, or clubbing.  NEUROLOGIC: Cranial nerves II through XII are intact. Muscle strength 5/5 in all extremities. Sensation intact. Gait not checked.  PSYCHIATRIC: The patient is alert and oriented x 3.  SKIN: No obvious rash, lesion, or ulcer.   LABORATORY PANEL:   CBC  Recent Labs Lab 04/27/16 1517  WBC 5.9  HGB 13.2  HCT 37.4  PLT 167   ------------------------------------------------------------------------------------------------------------------  Chemistries   Recent Labs Lab 04/27/16 1517  NA 136  K 4.1  CL 106  CO2 23  GLUCOSE 182*  BUN 16  CREATININE 0.89  CALCIUM 8.9   ------------------------------------------------------------------------------------------------------------------  Cardiac Enzymes  Recent Labs Lab 04/27/16 1517  TROPONINI <0.03   ------------------------------------------------------------------------------------------------------------------  RADIOLOGY:  Dg Chest 2 View  Result Date: 04/27/2016 CLINICAL DATA:  Chest pain EXAM: CHEST  2 VIEW COMPARISON:  07/18/2010 FINDINGS: The heart size and mediastinal contours are within normal limits. Both lungs are clear. The visualized skeletal structures are unremarkable. IMPRESSION: No active cardiopulmonary disease. Electronically Signed   By: Signa Kellaylor  Stroud M.D.   On: 04/27/2016 15:58    EKG:   Orders placed or performed during the hospital encounter of 04/27/16  . EKG 12-Lead  . EKG 12-Lead  . ED EKG within 10 minutes  . ED EKG within 10 minutes    IMPRESSION AND PLAN:   Stephanie Braun  is a 55 y.o. female with a known history of Congestive heart failure, not on diuretics, cirrhosis due to hepatitis C,  diabetes mellitus, essential hypertension, polysubstance abuse is presenting to the ED with a chief complaint of chest pain. Patient reports while she was smoking at around 2 PM she developed left-sided chest pain, feeling tight in chest radiating to the left shoulder, 10 out of 10, associated with nausea, shortness of breath, dizziness but denies loss of consciousness. EMS gave her 3 sublingual nitroglycerin sprays which brought her pain down to 5 out of 10   #Chest pain Admit to telemetry Cycle cardiac biomarkers Continue oxygen, nitroglycerin as needed. Aspirin once daily Not considering beta blocker in view of bradycardia and also given the past medical history of polysubstance abuse Will get echocardiogram Check fasting lipid panel in a.m. patient is allergic to atorvastatin . We will start her on Pravastatin [facility]  #History of chronic liver cirrhosis from hepatitis C Not on any medications. Check CMP in a.m. Outpatient follow-up with gastroenterology as recommended  #Diabetes mellitus-non-insulin-dependent Check hemoglobin A1c in a.m. Holding metformin which is her home medication and provide sliding scale insulin  #Chronic congestive heart failure Not on any diuretics at home. Patient does not have a primary cardiologist Currently not fluid overloaded Check echocardiogram  #Tobacco abuse Consultation to quit smoking for 3-5 minutes. She verbalized understanding. We will start nicotine patch from a.m. after ruling out acute MI  Provide DVT prophylaxis with Lovenox  subcutaneous   All the records are reviewed and case discussed with ED provider. Management plans discussed with the patient, family and they are in agreement.  CODE STATUS: fc/son is HCPOA  TOTAL TIME TAKING CARE OF THIS PATIENT: 42 minutes.   Note: This dictation was prepared with Dragon dictation along with smaller phrase technology. Any transcriptional errors that result from this process  are unintentional.  Ramonita Lab M.D on 04/27/2016 at 5:33 PM  Between 7am to 6pm - Pager - [phone]  After 6pm go to [email or web address] - password [email or web address] [facility]  [facility]  Office  [phone]  CC: Primary care physician; No PCP Per Patient

## 2016-04-27 NOTE — ED Provider Notes (Signed)
[facility] Emergency Department Provider Note  ____________________________________________  Time seen: Approximately 3:41 PM  I have reviewed the triage vital signs and the nursing notes.   HISTORY  Chief Complaint Chest Pain   HPI Stephanie Braun is a 55 y.o. female with h/o CHF, cirrhosis due to hepatitis, polysubstance abuse, DM, HTN, anxiety who presents for evaluation of CP. Patient reports that she was outside on the porch smoking a cigarette when she developed left-sided chest pressure. She reports initially was a 10 out of 10 chest pressure associated with nausea, shortness of breath, lightheadedness, numbness of her left arm. She called her daughter-in-law who called the ambulance. When ambulance arrived they gave her 3 nitroglycerin sprays with resolution of the pain. She now endorses that the pain is back 5/10, pressure located in the left side of her chest and nonradiating. She feels nauseous but denies shortness of breath or dizziness at this time. Patient is smoker. She endorses using methadone 2 days ago however denies any other drug use for the last 2 months. She has a strong family history both paternal and maternal of ischemic heart disease. She has never had a stress test or left heart catheter. She hasn't been taking any of her medications for a year and a half due to lack of insurance. She denies cough or fever, personal or fh blood clots, recent travel immobilization, leg pain or swelling, hemoptysis, or exogenous hormones.  Past Medical History:  Diagnosis Date  . Anxiety   . CHF (congestive heart failure) (HCC)   . Cirrhosis (HCC)   . Diabetes mellitus without complication (HCC)   . Hep C w/ coma, chronic (HCC)   . Hypertension     There are no active problems to display for this patient.   Past Surgical History:  Procedure Laterality Date  . APPENDECTOMY    . HAND SURGERY Right     Prior to Admission medications   Medication  Sig Start Date End Date Taking? Authorizing Provider  dicyclomine (BENTYL) 10 MG capsule Take 1 capsule (10 mg total) by mouth 3 (three) times daily before meals. 10/02/15 10/16/15  Rebecka ApleyAllison P Webster, MD  etodolac (LODINE) 200 MG capsule Take 1 capsule (200 mg total) by mouth every 8 (eight) hours. 10/02/15   Rebecka ApleyAllison P Webster, MD    Allergies Review of patient's allergies indicates no known allergies.  No family history on file.  Social History Social History  Substance Use Topics  . Smoking status: Current Every Day Smoker    Packs/day: 0.50    Types: Cigarettes  . Smokeless tobacco: Not on file  . Alcohol use No    Review of Systems  Constitutional: Negative for fever. + Lightheadedness Eyes: Negative for visual changes. ENT: Negative for sore throat. Cardiovascular: + chest pain. Respiratory: + shortness of breath. Gastrointestinal: Negative for abdominal pain, vomiting or diarrhea. + Nausea Genitourinary: Negative for dysuria. Musculoskeletal: Negative for back pain. Skin: Negative for rash. Neurological: Negative for headaches, weakness or numbness.  ____________________________________________   PHYSICAL EXAM:  VITAL SIGNS: ED Triage Vitals  Enc Vitals Group     BP 04/27/16 1518 133/80     Pulse Rate 04/27/16 1518 (!) 55     Resp 04/27/16 1518 18     Temp 04/27/16 1518 97.5 F (36.4 C)     Temp Source 04/27/16 1518 Oral     SpO2 04/27/16 1518 99 %     Weight 04/27/16 1518 160 lb (72.6 kg)  Height 04/27/16 1518 5\' 3"  (1.6 m)     Head Circumference --      Peak Flow --      Pain Score 04/27/16 1519 6     Pain Loc --      Pain Edu? --      Excl. in GC? --     Constitutional: Alert and oriented, looks older than stated age. No distress HEENT:      Head: Normocephalic and atraumatic.         Eyes: Conjunctivae are normal. Sclera is non-icteric. EOMI. PERRL      Mouth/Throat: Mucous membranes are moist.       Neck: Supple with no signs of  meningismus. Cardiovascular: Regular rate and rhythm. No murmurs, gallops, or rubs. 2+ symmetrical distal pulses are present in all extremities. No JVD. Respiratory: Normal respiratory effort. Lungs are clear to auscultation bilaterally. No wheezes, crackles, or rhonchi.  Gastrointestinal: Soft, non tender, and non distended with positive bowel sounds. No rebound or guarding. Genitourinary: No CVA tenderness. Musculoskeletal: Nontender with normal range of motion in all extremities. No edema, cyanosis, or erythema of extremities. Neurologic: Normal speech and language. Face is symmetric. Moving all extremities. No gross focal neurologic deficits are appreciated. Skin: Skin is warm, dry and intact. No rash noted. Psychiatric: Mood and affect are normal. Speech and behavior are normal.  ____________________________________________   LABS (all labs ordered are listed, but only abnormal results are displayed)  Labs Reviewed  CBC  BASIC METABOLIC PANEL  TROPONIN I   ____________________________________________  EKG  ED ECG REPORT I, Nita Sickle, the attending physician, personally viewed and interpreted this ECG.   Normal sinus rhythm, rate of 116, normal intervals, normal axis, no ST elevations or depressions. ____________________________________________  RADIOLOGY  CXR: Negative ____________________________________________   PROCEDURES  Procedure(s) performed: None Procedures Critical Care performed:  None ____________________________________________   INITIAL IMPRESSION / ASSESSMENT AND PLAN / ED COURSE  55 y.o. female with h/o CHF, cirrhosis due to hepatitis, polysubstance abuse, DM, HTN, anxiety who presents for evaluation of CP. Patient with chest tightness associated with shortness of breath, dizziness, radiating to her left arm. History concerning for ACS. Patient heart score 5. Initial EKG with no ischemic changes. Patient received full ASA and 2 sublingual  nitros with resolution of her pain. First troponin is negative. We'll discuss with the hospitalist for admission.  Clinical Course    Pertinent labs & imaging results that were available during my care of the patient were reviewed by me and considered in my medical decision making (see chart for details).    ____________________________________________   FINAL CLINICAL IMPRESSION(S) / ED DIAGNOSES  Final diagnoses:  None      NEW MEDICATIONS STARTED DURING THIS VISIT:  New Prescriptions   No medications on file     Note:  This document was prepared using Dragon voice recognition software and may include unintentional dictation errors.    Nita Sickle, MD 04/27/16 [phone]

## 2016-04-27 NOTE — Progress Notes (Addendum)
Pt transferred from ED to floor. Pt has no complaints, except that she would "love a cigarette". VS: BP (!) 167/115   Pulse 69   Temp 97.9 F (36.6 C) (Oral)   Resp 18   Ht 5\' 3"  (1.6 m)   Wt 72.8 kg (160 lb 9.6 oz) Comment: standing scale  SpO2 100%   BMI 28.45 kg/m . Dr. Amado Coe notified that BP is elevated, PRN hydralazine added to [facility] in epic. Pt placed on telemetry, box #[id number]. Pt resting in bed and eating dinner.

## 2016-04-27 NOTE — ED Notes (Signed)
Patient placed on 2L [place] due to SpO2 89%.

## 2016-04-28 ENCOUNTER — Inpatient Hospital Stay
Admit: 2016-04-28 | Discharge: 2016-04-28 | Disposition: A | Discharge status: Home / Self Care | Payer: Self-pay | Attending: Cardiology | Admitting: Cardiology

## 2016-04-28 DIAGNOSIS — I219 Acute myocardial infarction, unspecified: Secondary | ICD-10-CM | POA: Diagnosis present

## 2016-04-28 LAB — PROTIME-INR
INR: 1
Prothrombin Time: 13.2 seconds (ref 11.4–15.2)

## 2016-04-28 LAB — GLUCOSE, CAPILLARY
GLUCOSE-CAPILLARY: 111 mg/dL — AB (ref 65–99)
GLUCOSE-CAPILLARY: 98 mg/dL (ref 65–99)
Glucose-Capillary: 104 mg/dL — ABNORMAL HIGH (ref 65–99)
Glucose-Capillary: 87 mg/dL (ref 65–99)

## 2016-04-28 LAB — CBC
HCT: 37.4 % (ref 35.0–47.0)
HEMOGLOBIN: 13.3 g/dL (ref 12.0–16.0)
MCH: 31.3 pg (ref 26.0–34.0)
MCHC: 35.5 g/dL (ref 32.0–36.0)
MCV: 88.4 fL (ref 80.0–100.0)
Platelets: 152 10*3/uL (ref 150–440)
RBC: 4.24 MIL/uL (ref 3.80–5.20)
RDW: 13 % (ref 11.5–14.5)
WBC: 9.3 10*3/uL (ref 3.6–11.0)

## 2016-04-28 LAB — LIPID PANEL
CHOL/HDL RATIO: 5.9 ratio
CHOLESTEROL: 201 mg/dL — AB (ref 0–200)
HDL: 34 mg/dL — AB (ref >40)
LDL Cholesterol: 136 mg/dL — ABNORMAL HIGH (ref 0–99)
TRIGLYCERIDES: 153 mg/dL — AB (ref <150)
VLDL: 31 mg/dL (ref 0–40)

## 2016-04-28 LAB — HEPARIN LEVEL (UNFRACTIONATED)
HEPARIN UNFRACTIONATED: 0.23 [IU]/mL — AB (ref 0.30–0.70)
Heparin Unfractionated: 0.12 IU/mL — ABNORMAL LOW (ref 0.30–0.70)

## 2016-04-28 LAB — TROPONIN I
Troponin I: 1.88 ng/mL (ref <0.03)
Troponin I: 2.85 ng/mL (ref <0.03)
Troponin I: 5.88 ng/mL (ref <0.03)

## 2016-04-28 LAB — APTT: aPTT: 39 seconds — ABNORMAL HIGH (ref 24–36)

## 2016-04-28 LAB — HEMOGLOBIN A1C: HEMOGLOBIN A1C: 6.1 % — AB (ref 4.0–6.0)

## 2016-04-28 MED ORDER — ASPIRIN 81 MG PO CHEW
81.0000 mg | CHEWABLE_TABLET | ORAL | Status: AC
  Administered 2016-04-29: 81 mg via ORAL
  Filled 2016-04-28: qty 1

## 2016-04-28 MED ORDER — HEPARIN BOLUS VIA INFUSION
2050.0000 [IU] | Freq: Once | INTRAVENOUS | Status: AC
  Administered 2016-04-28: 2050 [IU] via INTRAVENOUS
  Filled 2016-04-28: qty 2050

## 2016-04-28 MED ORDER — HEPARIN BOLUS VIA INFUSION
1000.0000 [IU] | Freq: Once | INTRAVENOUS | Status: AC
  Administered 2016-04-28: 1000 [IU] via INTRAVENOUS
  Filled 2016-04-28: qty 1000

## 2016-04-28 MED ORDER — SODIUM CHLORIDE 0.9% FLUSH
3.0000 mL | Freq: Two times a day (BID) | INTRAVENOUS | Status: DC
  Administered 2016-04-28: 3 mL via INTRAVENOUS

## 2016-04-28 MED ORDER — SODIUM CHLORIDE 0.9% FLUSH: 3.0000 mL | INTRAVENOUS | Status: DC | PRN

## 2016-04-28 MED ORDER — SODIUM CHLORIDE 0.9 % WEIGHT BASED INFUSION: 3.0000 mL/kg/h | INTRAVENOUS | Status: AC

## 2016-04-28 MED ORDER — SODIUM CHLORIDE 0.9 % WEIGHT BASED INFUSION: 1.0000 mL/kg/h | INTRAVENOUS | Status: DC

## 2016-04-28 MED ORDER — SODIUM CHLORIDE 0.9 % IV SOLN: 250.0000 mL | INTRAVENOUS | Status: DC | PRN

## 2016-04-28 MED ORDER — HEPARIN BOLUS VIA INFUSION
4000.0000 [IU] | Freq: Once | INTRAVENOUS | Status: AC
  Administered 2016-04-28: 4000 [IU] via INTRAVENOUS
  Filled 2016-04-28: qty 4000

## 2016-04-28 MED ORDER — HEPARIN (PORCINE) IN NACL 100-0.45 UNIT/ML-% IJ SOLN
1350.0000 [IU]/h | INTRAMUSCULAR | Status: DC
  Administered 2016-04-28: 800 [IU]/h via INTRAVENOUS
  Administered 2016-04-29: 1200 [IU]/h via INTRAVENOUS
  Filled 2016-04-28 (×3): qty 250

## 2016-04-28 NOTE — Progress Notes (Signed)
ANTICOAGULATION CONSULT NOTE -follow up Consult  Pharmacy Consult for heparin drip Indication: ACS/STEMI  Allergies  Allergen Reactions  . Atorvastatin Other (See Comments)    Patient states this medication makes her chest hurt.    Patient Measurements: Height: 5\' 3"  (160 cm) Weight: 160 lb 9.6 oz (72.8 kg) (standing scale) IBW/kg (Calculated) : 52.4 Heparin Dosing Weight: 68 kg  Vital Signs: Temp: 98 F (36.7 C) (08/27 1109) Temp Source: Oral (08/27 1109) BP: 141/69 (08/27 1109) Pulse Rate: 52 (08/27 1109)  Labs:  Recent Labs  04/27/16 1517 04/28/16 0026 04/28/16 0351 04/28/16 0556 04/28/16 1129  HGB 13.2  --   --   --  13.3  HCT 37.4  --   --   --  37.4  PLT 167  --   --   --  152  APTT  --   --  39*  --   --   LABPROT  --   --  13.2  --   --   INR  --   --  1.00  --   --   HEPARINUNFRC  --   --   --   --  0.23*  CREATININE 0.89  --   --   --   --   TROPONINI <0.03 1.88*  --  2.85* 5.88*    Estimated Creatinine Clearance: 66.7 mL/min (by C-G formula based on SCr of 0.89 mg/dL).   Medical History: Past Medical History:  Diagnosis Date  . Anxiety   . CHF (congestive heart failure) (HCC)   . Cirrhosis (HCC)   . Diabetes mellitus without complication (HCC)   . Hep C w/ coma, chronic (HCC)   . Hypertension     Medications:  Patient not currently on anticoagulation PTA.  Assessment: Patient + NSTEMI per cardiology note. APTT 39   INR 1.00  Goal of Therapy:  Heparin level 0.3-0.7 units/ml Monitor platelets by anticoagulation protocol: Yes   Plan:  4000 unit bolus and initial rate of 800 units/hr. First anti-Xa 6 hours after start of infusion.  8/27- HL at 1129= 0.23.  Will order Heparin bolus of 1000 units x 1 and increase rate to 950 units/hr. Will recheck next Heparin level in 6 hrs at 2000.   Ellajane Stong A 04/28/2016,1:26 PM

## 2016-04-28 NOTE — Progress Notes (Signed)
MD Sheryle Hail made aware of new troponin level of 1.88 An order for heparin gtt was entered by MD.

## 2016-04-28 NOTE — Clinical Social Work Note (Signed)
Clinical Social Work Assessment  Patient Details  Name: Stephanie Braun MRN: [id number] Date of Birth: 04-25-1961  Date of referral:  04/28/16               Reason for consult:  Insurance Barriers                Permission sought to share information with:    Permission granted to share information::  No  Name::        Agency::     Relationship::     Contact Information:     Housing/Transportation Living arrangements for the past 2 months:  Single Family Home Source of Information:  Patient Patient Interpreter Needed:  None Criminal Activity/Legal Involvement Pertinent to Current Situation/Hospitalization:  No - Comment as needed Significant Relationships:  Adult Children Lives with:  Adult Children Do you feel safe going back to the place where you live?  Yes Need for family participation in patient care:  No (Coment)  Care giving concerns:  Patient requested CSW for insurance concerns.   Social Worker assessment / plan:  Patient is alert and oriented x4. Patient was willing to discuss concerns with CSW.  Patient currently lives with her son and daughter-in-law in a single family home. At baseline, patient is independent in all ADL and IADL functions, including driving. Patient is unemployed and wanted information about Medicaid.  CSW provided Medicaid application and information on process, including contact information for [facility]. Patient was able to verbalize in her own terms this process.   Patient reports that if medically indicated, she would not like to pursue SNF placement.  Employment status:  Unemployed Health and safety inspector:  Self Pay (Medicaid Pending) PT Recommendations:  Not assessed at this time Information / Referral to community resources:  Other (Comment Required) [facility] DSS for [facility] application)  Patient/Family's Response to care:  Patient was pleasant and accepting of CSW resource information.  Patient/Family's Understanding of and  Emotional Response to Diagnosis, Current Treatment, and Prognosis:  Patient was able to verbalize in her own terms how to apply for Medicaid.  Emotional Assessment Appearance:  Appears stated age Attitude/Demeanor/Rapport:   (Patient was pleasant.) Affect (typically observed):  Accepting, Appropriate Orientation:  Oriented to Self, Oriented to Place, Oriented to  Time, Oriented to Situation Alcohol / Substance use:  Never Used Psych involvement (Current and /or in the community):  No (Comment)  Discharge Needs  Concerns to be addressed:  Financial / Insurance Concerns Readmission within the last 30 days:  No Current discharge risk:  None Barriers to Discharge:  Continued Medical Work up   [employer], LCSW 04/28/2016, 11:41 AM

## 2016-04-28 NOTE — Progress Notes (Signed)
[facility] - [place] at [facility]                                                                                                                                                                                            Patient Demographics   Stephanie Braun, is a 55 y.o. female, DOB - 04-May-1961, KVQ:[id number]  Admit date - 04/27/2016   Admitting Physician Ramonita Lab, MD  Outpatient Primary MD for the patient is No PCP Per Patient   LOS - 0  Subjective:Patient admitted with chest pain now chest pain resolves noticed to have acute MI denies any shortness of breath     Review of Systems:   CONSTITUTIONAL: No documented fever. No fatigue, weakness. No weight gain, no weight loss.  EYES: No blurry or double vision.  ENT: No tinnitus. No postnasal drip. No redness of the oropharynx.  RESPIRATORY: No cough, no wheeze, no hemoptysis. No dyspnea.  CARDIOVASCULAR: No chest pain. No orthopnea. No palpitations. No syncope.  GASTROINTESTINAL: No nausea, no vomiting or diarrhea. No abdominal pain. No melena or hematochezia.  GENITOURINARY: No dysuria or hematuria.  ENDOCRINE: No polyuria or nocturia. No heat or cold intolerance.  HEMATOLOGY: No anemia. No bruising. No bleeding.  INTEGUMENTARY: No rashes. No lesions.  MUSCULOSKELETAL: No arthritis. No swelling. No gout.  NEUROLOGIC: No numbness, tingling, or ataxia. No seizure-type activity.  PSYCHIATRIC: No anxiety. No insomnia. No ADD.    Vitals:   Vitals:   04/27/16 1848 04/27/16 1929 04/28/16 0511 04/28/16 1109  BP: (!) 167/115 (!) 169/88 120/61 (!) 141/69  Pulse: 69 70 (!) 50 (!) 52  Resp: 18 16 16 18   Temp: 97.9 F (36.6 C) 98.2 F (36.8 C) 98 F (36.7 C) 98 F (36.7 C)  TempSrc: Oral Oral Oral Oral  SpO2: 100% 99% 93% 97%  Weight:      Height:        Wt Readings from Last 3 Encounters:  04/27/16 72.8 kg (160 lb 9.6 oz)  10/02/15 72.6 kg (160 lb)     Intake/Output Summary (Last 24 hours) at  04/28/16 1527 Last data filed at 04/28/16 1500  Gross per 24 hour  Intake           572.42 ml  Output              170 ml  Net           402.42 ml    Physical Exam:   GENERAL: Pleasant-appearing in no apparent distress.  HEAD, EYES, EARS, NOSE AND THROAT: Atraumatic, normocephalic. Extraocular muscles are intact. Pupils equal and reactive to light.  Sclerae anicteric. No conjunctival injection. No oro-pharyngeal erythema.  NECK: Supple. There is no jugular venous distention. No bruits, no lymphadenopathy, no thyromegaly.  HEART: Regular rate and rhythm,. No murmurs, no rubs, no clicks.  LUNGS: Clear to auscultation bilaterally. No rales or rhonchi. No wheezes.  ABDOMEN: Soft, flat, nontender, nondistended. Has good bowel sounds. No hepatosplenomegaly appreciated.  EXTREMITIES: No evidence of any cyanosis, clubbing, or peripheral edema.  +2 pedal and radial pulses bilaterally.  NEUROLOGIC: The patient is alert, awake, and oriented x3 with no focal motor or sensory deficits appreciated bilaterally.  SKIN: Moist and warm with no rashes appreciated.  Psych: Not anxious, depressed LN: No inguinal LN enlargement    Antibiotics   Anti-infectives    None      Medications   Scheduled Meds: . [START ON 04/29/2016] aspirin  81 mg Oral Pre-Cath  . aspirin EC  325 mg Oral Daily  . dicyclomine  10 mg Oral TID AC  . etodolac  200 mg Oral Q8H  . insulin aspart  0-5 Units Subcutaneous QHS  . insulin aspart  0-9 Units Subcutaneous TID WC  . lisinopril  10 mg Oral Daily  . omega-3 acid ethyl esters  1 g Oral BID  . sodium chloride flush  3 mL Intravenous Q12H   Continuous Infusions: . [START ON 04/29/2016] sodium chloride     Followed by  . [START ON 04/29/2016] sodium chloride    . heparin 950 Units/hr (04/28/16 1334)   PRN Meds:.sodium chloride, acetaminophen, gi cocktail, hydrALAZINE, morphine injection, nitroGLYCERIN, ondansetron (ZOFRAN) IV, sodium chloride flush, zolpidem   Data  Review:   Micro Results No results found for this or any previous visit (from the past 240 hour(s)).  Radiology Reports Dg Chest 2 View  Result Date: 04/27/2016 CLINICAL DATA:  Chest pain EXAM: CHEST  2 VIEW COMPARISON:  07/18/2010 FINDINGS: The heart size and mediastinal contours are within normal limits. Both lungs are clear. The visualized skeletal structures are unremarkable. IMPRESSION: No active cardiopulmonary disease. Electronically Signed   By: Signa Kellaylor  [place] M.D.   On: 04/27/2016 15:58     CBC  Recent Labs Lab 04/27/16 1517 04/28/16 1129  WBC 5.9 9.3  HGB 13.2 13.3  HCT 37.4 37.4  PLT 167 152  MCV 87.6 88.4  MCH 30.9 31.3  MCHC 35.3 35.5  RDW 13.1 13.0    Chemistries   Recent Labs Lab 04/27/16 1517  NA 136  K 4.1  CL 106  CO2 23  GLUCOSE 182*  BUN 16  CREATININE 0.89  CALCIUM 8.9   ------------------------------------------------------------------------------------------------------------------ estimated creatinine clearance is 66.7 mL/min (by C-G formula based on SCr of 0.89 mg/dL). ------------------------------------------------------------------------------------------------------------------  Recent Labs  04/28/16 0026  HGBA1C 6.1*   ------------------------------------------------------------------------------------------------------------------  Recent Labs  04/28/16 0026  CHOL 201*  HDL 34*  LDLCALC 136*  TRIG 153*  CHOLHDL 5.9   ------------------------------------------------------------------------------------------------------------------ No results for input(s): TSH, T4TOTAL, T3FREE, THYROIDAB in the last 72 hours.  Invalid input(s): FREET3 ------------------------------------------------------------------------------------------------------------------ No results for input(s): VITAMINB12, FOLATE, FERRITIN, TIBC, IRON, RETICCTPCT in the last 72 hours.  Coagulation profile  Recent Labs Lab 04/28/16 0351  INR 1.00    No  results for input(s): DDIMER in the last 72 hours.  Cardiac Enzymes  Recent Labs Lab 04/28/16 0026 04/28/16 0556 04/28/16 1129  TROPONINI 1.88* 2.85* 5.88*   ------------------------------------------------------------------------------------------------------------------ Invalid input(s): POCBNP    Assessment & Plan   Stephanie Braun  is a 55 y.o. female with a known history of Congestive heart  failure, not on diuretics, cirrhosis due to hepatitis C, diabetes mellitus, essential hypertension, polysubstance abuse is presenting to the ED with a chief complaint of chest pain. Patient reports while she was smoking at around 2 PM she developed left-sided chest pain, feeling tight in chest radiating to the left shoulder, 10 out of 10, associated with nausea, shortness of breath, dizziness but denies loss of consciousness. EMS gave her 3 sublingual nitroglycerin sprays which brought her pain down to 5 out of 10  #Acute non-ST MI Continue heparin Plan for cardiac catheter tomorrow Unable to take statins Continue Zetia Continue aspirin   #History of chronic liver cirrhosis from hepatitis C Not on any medications. Outpatient follow GI   #Diabetes mellitus-non-insulin-dependent Hemoglobin A1c is 6.1 metformin on hold for cardiac catheter  #Chronic congestive heart failure Not on any diuretics at home.  Echocardiogram pending currently compensated  #Tobacco abuse Consultation to quit smoking for 3-5 minutes. She verbalized      Code Status Orders        Start     Ordered   04/27/16 1838  Full code  Continuous     04/27/16 1837    Code Status History    Date Active Date Inactive Code Status Order ID Comments User Context   04/27/2016  6:38 PM 04/28/2016  2:38 PM Full Code [id number]  Ramonita Lab, MD Inpatient           Consults Cardiology   DVT Prophylaxis heparin  Lab Results  Component Value Date   PLT 152 04/28/2016     Time Spent in minutes   Greater than 50% of time spent in care coordination and counseling patient regarding the condition and plan of care.   Auburn Bilberry M.D on 04/28/2016 at 3:27 PM  Between 7am to 6pm - Pager - [phone]  After 6pm go to [email or web address] - password [email or web address] [facility]  [facility]   Office  [phone]

## 2016-04-28 NOTE — Consult Note (Signed)
[facility] CLINIC CARDIOLOGY [facility]  CARDIOLOGY CONSULT NOTE  Patient ID: Stephanie Braun MRN: [id number] DOB/AGE: 1961-01-30 55 y.o.  Admit date: 04/27/2016 Referring Physician Dr. Allena Katz Primary Physician unc Primary Cardiologist   Reason for Consultation chest pain, substance abuse, nstemi  HPI:  Pt is a 55 yo female with a known history of Congestive heart failure, not on diuretics, cirrhosis due to hepatitis C, diabetes mellitus, essential hypertension, polysubstance abuse is presenting to the ED with a chief complaint of chest pain.She developed chest pain yesterday afternoon in the left side of the chest with radiation to her left shoulder associated with sob, nausea. She described the pain as 10/10. She uses methadone and her urine drug screen was positive for tricyclics, cocaine, benzodiazepines and methadone. Her initial troponin was normal with subsequent values increasing to 2.85. EKG revealed sinus bradycardia at 45 with non specific st t wave changes. She was treated with asa, heparin, lisinopril and prn nitrates. Beta blockers held due to bradycardia. Reports prior polysubstance abuse but denies present use. Smokes about 1 pack a day of cigarettes. Currently pain free.    Review of Systems  Constitutional: Negative.   HENT: Negative.   Eyes: Negative.   Respiratory: Negative.   Cardiovascular: Positive for chest pain.  Gastrointestinal: Negative.   Genitourinary: Negative.   Musculoskeletal: Negative.   Skin: Negative.   Neurological: Negative.   Endo/Heme/Allergies: Negative.   Psychiatric/Behavioral: Positive for substance abuse.    Past Medical History:  Diagnosis Date  . Anxiety   . CHF (congestive heart failure) (HCC)   . Cirrhosis (HCC)   . Diabetes mellitus without complication (HCC)   . Hep C w/ coma, chronic (HCC)   . Hypertension     History reviewed. No pertinent family history.  Social History   Social History  . Marital status: Single     Spouse name: N/A  . Number of children: N/A  . Years of education: N/A   Occupational History  . Not on file.   Social History Main Topics  . Smoking status: Current Every Day Smoker    Packs/day: 0.50    Types: Cigarettes  . Smokeless tobacco: Never Used  . Alcohol use No  . Drug use: Unknown  . Sexual activity: Not on file   Other Topics Concern  . Not on file   Social History Narrative  . No narrative on file    Past Surgical History:  Procedure Laterality Date  . APPENDECTOMY    . HAND SURGERY Right      Prescriptions Prior to Admission  Medication Sig Dispense Refill Last Dose  . dicyclomine (BENTYL) 10 MG capsule Take 1 capsule (10 mg total) by mouth 3 (three) times daily before meals. 20 capsule 0   . etodolac (LODINE) 200 MG capsule Take 1 capsule (200 mg total) by mouth every 8 (eight) hours. 12 capsule 0   . lisinopril (PRINIVIL,ZESTRIL) 10 MG tablet Take 10 mg by mouth daily.    Not Taking at Unknown time  . metFORMIN (GLUCOPHAGE) 500 MG tablet Take 500 mg by mouth 2 (two) times daily with a meal.    Not Taking at Unknown time    Physical Exam: Blood pressure 120/61, pulse (!) 50, temperature 98 F (36.7 C), temperature source Oral, resp. rate 16, height 5\' 3"  (1.6 m), weight 72.8 kg (160 lb 9.6 oz), SpO2 93 %.   Wt Readings from Last 1 Encounters:  04/27/16 72.8 kg (160 lb 9.6 oz)  General appearance: alert and cooperative Resp: clear to auscultation bilaterally Chest wall: no tenderness Cardio: sinus bradycardia GI: soft, non-tender; bowel sounds normal; no masses,  no organomegaly Extremities: extremities normal, atraumatic, no cyanosis or edema Pulses: 2+ and symmetric Neurologic: Grossly normal  Labs:   Lab Results  Component Value Date   WBC 5.9 04/27/2016   HGB 13.2 04/27/2016   HCT 37.4 04/27/2016   MCV 87.6 04/27/2016   PLT 167 04/27/2016    Recent Labs Lab 04/27/16 1517  NA 136  K 4.1  CL 106  CO2 23  BUN 16   CREATININE 0.89  CALCIUM 8.9  GLUCOSE 182*   Lab Results  Component Value Date   TROPONINI 2.85 (HH) 04/28/2016      Radiology: No active cardiopulmonary disease. EKG: sinus bradycardia with no ischemia  ASSESSMENT AND PLAN:  55 yo female with a known history of Congestive heart failure, not on diuretics, cirrhosis due to hepatitis C, diabetes mellitus, essential hypertension, polysubstance abuse is presenting to the ED with a chief complaint of chest pain. Has ruled in for a nstemi. Pain free at present. Denies current substance abuse other than tobacco but drug screen positive for cocaine, methadone and benzos. Currently stable. Will need left cardiac cath to evaluate anatomy given rest pain and nstemi. Risk and benefits discussed. Further recs after cath. Conitnue with heparin, prn nitrates, asa. Hold beta blockers due to bradycardia and statin due to intolerance in the past and hep c.  Signed: Dalia HeadingFATH,Prisha Hiley A. MD, [facility] 04/28/2016, 8:24 AM

## 2016-04-28 NOTE — Progress Notes (Addendum)
ANTICOAGULATION CONSULT NOTE -follow up Consult  Pharmacy Consult for heparin drip Indication: ACS/STEMI  Allergies  Allergen Reactions  . Atorvastatin Other (See Comments)    Patient states this medication makes her chest hurt.    Patient Measurements: Height: 5\' 3"  (160 cm) Weight: 160 lb 9.6 oz (72.8 kg) (standing scale) IBW/kg (Calculated) : 52.4 Heparin Dosing Weight: 68 kg  Vital Signs: Temp: 98.2 F (36.8 C) (08/27 1923) Temp Source: Oral (08/27 1923) BP: 144/61 (08/27 1923) Pulse Rate: 52 (08/27 1923)  Labs:  Recent Labs  04/27/16 1517 04/28/16 0026 04/28/16 0351 04/28/16 0556 04/28/16 1129 04/28/16 1943  HGB 13.2  --   --   --  13.3  --   HCT 37.4  --   --   --  37.4  --   PLT 167  --   --   --  152  --   APTT  --   --  39*  --   --   --   LABPROT  --   --  13.2  --   --   --   INR  --   --  1.00  --   --   --   HEPARINUNFRC  --   --   --   --  0.23* 0.12*  CREATININE 0.89  --   --   --   --   --   TROPONINI <0.03 1.88*  --  2.85* 5.88*  --     Estimated Creatinine Clearance: 66.7 mL/min (by C-G formula based on SCr of 0.89 mg/dL).   Medical History: Past Medical History:  Diagnosis Date  . Anxiety   . CHF (congestive heart failure) (HCC)   . Cirrhosis (HCC)   . Diabetes mellitus without complication (HCC)   . Hep C w/ coma, chronic (HCC)   . Hypertension     Medications:  Patient not currently on anticoagulation PTA.  Assessment: Patient + NSTEMI per cardiology note. APTT 39   INR 1.00  Goal of Therapy:  Heparin level 0.3-0.7 units/ml Monitor platelets by anticoagulation protocol: Yes   Plan:  4000 unit bolus and initial rate of 800 units/hr. First anti-Xa 6 hours after start of infusion.  8/27- HL at 1129= 0.23.  Will order Heparin bolus of 1000 units x 1 and increase rate to 950 units/hr. Will recheck next Heparin level in 6 hrs at 2000.  8/27 HL 1943 HL=0.12 Spoke with RN is unaware of any stops in infusion, nor is any  documentation of. Will bolus 2050 units and increase rate to 1200 units/hr. Recheck in 6 hours  8/28 04:00 heparin level 0.25. 1000 unit bolus and increase rate to 1350 units/hr. Recheck in 6 hours.  Melissa D Maccia 04/28/2016,9:29 PM

## 2016-04-28 NOTE — Progress Notes (Signed)
Pt c/o Nausea, vomiting and indigestion on shift, GI cocktail administered for indigestion. Pt's HR also dropped to the 30's- 40's sustaining mainly in the 40's. MD Anne Hahn aware.

## 2016-04-28 NOTE — Progress Notes (Signed)
*  PRELIMINARY RESULTS* Echocardiogram 2D Echocardiogram has been performed.  [name] 04/28/2016, 3:01 PM

## 2016-04-28 NOTE — Progress Notes (Addendum)
ANTICOAGULATION CONSULT NOTE - Initial Consult  Pharmacy Consult for heparin drip Indication: ACS/STEMI  Allergies  Allergen Reactions  . Atorvastatin Other (See Comments)    Patient states this medication makes her chest hurt.    Patient Measurements: Height: 5\' 3"  (160 cm) Weight: 160 lb 9.6 oz (72.8 kg) (standing scale) IBW/kg (Calculated) : 52.4 Heparin Dosing Weight: 68 kg  Vital Signs: Temp: 98.2 F (36.8 C) (08/26 1929) Temp Source: Oral (08/26 1929) BP: 169/88 (08/26 1929) Pulse Rate: 70 (08/26 1929)  Labs:  Recent Labs  04/27/16 1517 04/28/16 0026  HGB 13.2  --   HCT 37.4  --   PLT 167  --   CREATININE 0.89  --   TROPONINI <0.03 1.88*    Estimated Creatinine Clearance: 66.7 mL/min (by C-G formula based on SCr of 0.89 mg/dL).   Medical History: Past Medical History:  Diagnosis Date  . Anxiety   . CHF (congestive heart failure) (HCC)   . Cirrhosis (HCC)   . Diabetes mellitus without complication (HCC)   . Hep C w/ coma, chronic (HCC)   . Hypertension     Medications:  Patient not currently on anticoagulation.  Assessment: APTT 39   INR 1.00  Goal of Therapy:  Heparin level 0.3-0.7 units/ml Monitor platelets by anticoagulation protocol: Yes   Plan:  4000 unit bolus and initial rate of 800 units/hr. First anti-Xa 6 hours after start of infusion.  Hena Ewalt S 04/28/2016,4:01 AM

## 2016-04-29 ENCOUNTER — Encounter
Admission: EM | Disposition: A | Discharge status: Home / Self Care | Payer: Self-pay | Source: Home / Self Care | Attending: Internal Medicine

## 2016-04-29 HISTORY — PX: CARDIAC CATHETERIZATION: SHX172

## 2016-04-29 LAB — CBC
HEMATOCRIT: 38.6 % (ref 35.0–47.0)
HEMOGLOBIN: 13.5 g/dL (ref 12.0–16.0)
MCH: 31 pg (ref 26.0–34.0)
MCHC: 35 g/dL (ref 32.0–36.0)
MCV: 88.6 fL (ref 80.0–100.0)
Platelets: 109 10*3/uL — ABNORMAL LOW (ref 150–440)
RBC: 4.36 MIL/uL (ref 3.80–5.20)
RDW: 13 % (ref 11.5–14.5)
WBC: 7 10*3/uL (ref 3.6–11.0)

## 2016-04-29 LAB — GLUCOSE, CAPILLARY: GLUCOSE-CAPILLARY: 137 mg/dL — AB (ref 65–99)

## 2016-04-29 LAB — ECHOCARDIOGRAM COMPLETE
HEIGHTINCHES: 63 in
WEIGHTICAEL: 2569.6 [oz_av]

## 2016-04-29 LAB — HEPARIN LEVEL (UNFRACTIONATED): Heparin Unfractionated: 0.25 IU/mL — ABNORMAL LOW (ref 0.30–0.70)

## 2016-04-29 SURGERY — LEFT HEART CATH AND CORONARY ANGIOGRAPHY
Anesthesia: Moderate Sedation

## 2016-04-29 MED ORDER — MIDAZOLAM HCL 2 MG/2ML IJ SOLN
INTRAMUSCULAR | Status: DC | PRN
  Administered 2016-04-29 (×2): 1 mg via INTRAVENOUS

## 2016-04-29 MED ORDER — HEPARIN BOLUS VIA INFUSION
1000.0000 [IU] | Freq: Once | INTRAVENOUS | Status: AC
  Administered 2016-04-29: 1000 [IU] via INTRAVENOUS
  Filled 2016-04-29: qty 1000

## 2016-04-29 MED ORDER — SODIUM CHLORIDE 0.9 % WEIGHT BASED INFUSION
3.0000 mL/kg/h | INTRAVENOUS | Status: AC
  Administered 2016-04-29: 3 mL/kg/h via INTRAVENOUS

## 2016-04-29 MED ORDER — FENTANYL CITRATE (PF) 100 MCG/2ML IJ SOLN
INTRAMUSCULAR | Status: DC | PRN
  Administered 2016-04-29 (×2): 50 ug via INTRAVENOUS

## 2016-04-29 MED ORDER — ISOSORBIDE MONONITRATE ER 30 MG PO TB24
30.0000 mg | ORAL_TABLET | Freq: Every day | ORAL | Status: DC
  Administered 2016-04-29: 30 mg via ORAL
  Filled 2016-04-29: qty 1

## 2016-04-29 MED ORDER — ISOSORBIDE MONONITRATE ER 30 MG PO TB24: 30.0000 mg | ORAL_TABLET | Freq: Every day | ORAL | 0 refills | Status: AC

## 2016-04-29 MED ORDER — SODIUM CHLORIDE 0.9% FLUSH
3.0000 mL | Freq: Two times a day (BID) | INTRAVENOUS | Status: DC
  Administered 2016-04-29: 3 mL via INTRAVENOUS

## 2016-04-29 MED ORDER — IOPAMIDOL (ISOVUE-300) INJECTION 61%
INTRAVENOUS | Status: DC | PRN
  Administered 2016-04-29: 90 mL via INTRA_ARTERIAL

## 2016-04-29 MED ORDER — OMEGA-3-ACID ETHYL ESTERS 1 G PO CAPS: 1.0000 g | ORAL_CAPSULE | Freq: Two times a day (BID) | ORAL | 0 refills | Status: AC

## 2016-04-29 MED ORDER — SODIUM CHLORIDE 0.9% FLUSH: 3.0000 mL | INTRAVENOUS | Status: DC | PRN

## 2016-04-29 MED ORDER — MIDAZOLAM HCL 2 MG/2ML IJ SOLN
INTRAMUSCULAR | Status: AC
  Filled 2016-04-29: qty 2

## 2016-04-29 MED ORDER — ONDANSETRON HCL 4 MG/2ML IJ SOLN: 4.0000 mg | Freq: Four times a day (QID) | INTRAMUSCULAR | Status: DC | PRN

## 2016-04-29 MED ORDER — HEPARIN (PORCINE) IN NACL 2-0.9 UNIT/ML-% IJ SOLN
INTRAMUSCULAR | Status: AC
  Filled 2016-04-29: qty 1000

## 2016-04-29 MED ORDER — FENTANYL CITRATE (PF) 100 MCG/2ML IJ SOLN
INTRAMUSCULAR | Status: AC
  Filled 2016-04-29: qty 2

## 2016-04-29 MED ORDER — ACETAMINOPHEN 325 MG PO TABS: 650.0000 mg | ORAL_TABLET | ORAL | Status: DC | PRN

## 2016-04-29 MED ORDER — ASPIRIN 325 MG PO TBEC: 325.0000 mg | DELAYED_RELEASE_TABLET | Freq: Every day | ORAL | 0 refills | Status: AC

## 2016-04-29 MED ORDER — SODIUM CHLORIDE 0.9 % IV SOLN: 250.0000 mL | INTRAVENOUS | Status: DC | PRN

## 2016-04-29 SURGICAL SUPPLY — 7 items
CATH 5FR JL4 DIAGNOSTIC (CATHETERS) ×3 IMPLANT
CATH INFINITI 5FR ANG PIGTAIL (CATHETERS) ×3 IMPLANT
CATH INFINITI JR4 5F (CATHETERS) ×9 IMPLANT
KIT MANI 3VAL PERCEP (MISCELLANEOUS) ×3 IMPLANT
NEEDLE PERC 18GX7CM (NEEDLE) ×3 IMPLANT
PACK CARDIAC CATH (CUSTOM PROCEDURE TRAY) ×3 IMPLANT
SHEATH AVANTI 5FR X 11CM (SHEATH) ×3 IMPLANT

## 2016-04-29 NOTE — Progress Notes (Signed)
[facility]  SUBJECTIVE: Cath completed. Medical management   Vitals:   04/28/16 1109 04/28/16 1923 04/29/16 0422 04/29/16 0704  BP: (!) 141/69 (!) 144/61 (!) 119/95 (!) 144/77  Pulse: (!) 52 (!) 52 63 68  Resp: 18 18  16   Temp: 98 F (36.7 C) 98.2 F (36.8 C) 97.8 F (36.6 C) 98.6 F (37 C)  TempSrc: Oral Oral Oral Oral  SpO2: 97% 99% 97% 96%  Weight:    72.6 kg (160 lb)  Height:    5\' 3"  (1.6 m)    Intake/Output Summary (Last 24 hours) at 04/29/16 0855 Last data filed at 04/29/16 0700  Gross per 24 hour  Intake          1007.07 ml  Output              800 ml  Net           207.07 ml    LABS: Basic Metabolic Panel:  Recent Labs  98/33/82 1517  NA 136  K 4.1  CL 106  CO2 23  GLUCOSE 182*  BUN 16  CREATININE 0.89  CALCIUM 8.9   Liver Function Tests: No results for input(s): AST, ALT, ALKPHOS, BILITOT, PROT, ALBUMIN in the last 72 hours. No results for input(s): LIPASE, AMYLASE in the last 72 hours. CBC:  Recent Labs  04/28/16 1129 04/29/16 0407  WBC 9.3 7.0  HGB 13.3 13.5  HCT 37.4 38.6  MCV 88.4 88.6  PLT 152 109*   Cardiac Enzymes:  Recent Labs  04/28/16 0026 04/28/16 0556 04/28/16 1129  TROPONINI 1.88* 2.85* 5.88*   BNP: Invalid input(s): POCBNP D-Dimer: No results for input(s): DDIMER in the last 72 hours. Hemoglobin A1C:  Recent Labs  04/28/16 0026  HGBA1C 6.1*   Fasting Lipid Panel:  Recent Labs  04/28/16 0026  CHOL 201*  HDL 34*  LDLCALC 136*  TRIG 153*  CHOLHDL 5.9   Thyroid Function Tests: No results for input(s): TSH, T4TOTAL, T3FREE, THYROIDAB in the last 72 hours.  Invalid input(s): FREET3 Anemia Panel: No results for input(s): VITAMINB12, FOLATE, FERRITIN, TIBC, IRON, RETICCTPCT in the last 72 hours.   Physical Exam: Blood pressure (!) 144/77, pulse 68, temperature 98.6 F (37 C), temperature source Oral, resp. rate 16, height 5\' 3"  (1.6 m), weight 72.6 kg (160 lb),  SpO2 96 %.   Wt Readings from Last 1 Encounters:  04/29/16 72.6 kg (160 lb)     General appearance: alert and cooperative Back: symmetric, no curvature. ROM normal. No CVA tenderness. Resp: clear to auscultation bilaterally Chest wall: no tenderness Cardio: regular rate and rhythm GI: soft, non-tender; bowel sounds normal; no masses,  no organomegaly Pulses: 2+ and symmetric Neurologic: Grossly normal  TELEMETRY: Reviewed telemetry pt in nsr:  ASSESSMENT AND PLAN:  Active Problems:   Chest pain   Acute MI (HCC)-mildly elevated serum troponin. Cath revealed occluded distal small codominant rca with collaterals from lad. No signficant disease in lad and left circ. EF normal. No indications for pci. Continue with asa 81 mg daily and long acting nitrates. Not a candidate for beta blockers due to bradycardia and not candidate for statin due to intolerance. Stop tobacco and cocaine use. Ambulate and consider discharge to home.     Dalia Heading., MD, [facility] 04/29/2016 8:55 AM

## 2016-04-29 NOTE — Progress Notes (Signed)
report to [place] telemetry.  Check right groin for bleeding or hematoma.  Patient will be on bedrest for 2 hours post sheath pull---out of bed at 11:00.  Bilateral pulses are 1's DP's.Marland Kitchen

## 2016-04-29 NOTE — Progress Notes (Signed)
Patient HOB elevated to 70 degrees no signs of bleeding or hematoma noted will continue to monitor site. VSS no complaints of pain at this time.

## 2016-04-29 NOTE — Progress Notes (Signed)
[facility] Physicians - [place] at [facility] was admitted to the Hospital on 04/27/2016 and Discharged  04/29/2016 and should be excused from work/school   for 5 days starting 04/27/2016 , may return to work/school without any restrictions.  Call Auburn Bilberry MD with questions.  Auburn Bilberry M.D on 04/29/2016,at 10:46 AM  [facility] - [place] at [facility]  [phone]

## 2016-04-29 NOTE — Progress Notes (Signed)
Patient discharged via wheelchair and private vehicle. IV removed and catheter intact. All discharge instructions given on medications and post cath instructions patient verbalizes understanding.Tele removed and returned. Prescriptions sent to patient pharmacy. No signs of bleeding or hematoma noted at site.

## 2016-04-29 NOTE — Progress Notes (Signed)
Patient ambulated in the hall no signs of bleeding or hematoma noted will continue to monitor. VSS, no complaints of pain at this time.

## 2016-04-29 NOTE — Discharge Summary (Signed)
Stephanie Braun, 55 y.o., DOB 01/27/1961, MRN [id number]. Admission date: 04/27/2016 Discharge Date 04/29/2016 Primary MD No PCP Per Patient Admitting Physician Ramonita LabAruna Gouru, MD  Admission Diagnosis  Chest pain, unspecified chest pain type [R07.9]  Discharge Diagnosis   Active Problems:   Chest pain   Non-ST acute MI   Cocaine use Coronary artery disease distal RCA occlusion not amendable to intervention Hyperlipidemia unable to take statins Chronic back pain   Hospital Course Patient is a 55 year old initially admitted subsequent workup revealed her troponin to be elevated in the 2 range. She was started on a heparin drip. She had also toxic drug screen which was positive for cocaine. Cardiac catheterization showed following results   Dist RCA lesion, 100 %stenosed.  Ost 1st Mrg lesion, 20 %stenosed.  Prox LAD lesion, 30 %stenosed.  Patient was recommended to be medically managed. Recommended not to use any drugs       Consults  cardiology  Significant Tests:  See full reports for all details     Dg Chest 2 View  Result Date: 04/27/2016 CLINICAL DATA:  Chest pain EXAM: CHEST  2 VIEW COMPARISON:  07/18/2010 FINDINGS: The heart size and mediastinal contours are within normal limits. Both lungs are clear. The visualized skeletal structures are unremarkable. IMPRESSION: No active cardiopulmonary disease. Electronically Signed   By: Signa Kellaylor  Stroud M.D.   On: 04/27/2016 15:58       Today   Subjective:   [name] feeling well denies any chest pains  Objective:   Blood pressure (!) 158/86, pulse 67, temperature 97.6 F (36.4 C), resp. rate 19, height 5\' 3"  (1.6 m), weight 72.6 kg (160 lb), SpO2 99 %.  .  Intake/Output Summary (Last 24 hours) at 04/29/16 1331 Last data filed at 04/29/16 1323  Gross per 24 hour  Intake           887.07 ml  Output             1200 ml  Net          -312.93 ml    Exam VITAL SIGNS: Blood pressure (!) 158/86, pulse 67, temperature 97.6  F (36.4 C), resp. rate 19, height 5\' 3"  (1.6 m), weight 72.6 kg (160 lb), SpO2 99 %.  GENERAL:  55 y.o.-year-old patient lying in the bed with no acute distress.  EYES: Pupils equal, round, reactive to light and accommodation. No scleral icterus. Extraocular muscles intact.  HEENT: Head atraumatic, normocephalic. Oropharynx and nasopharynx clear.  NECK:  Supple, no jugular venous distention. No thyroid enlargement, no tenderness.  LUNGS: Normal breath sounds bilaterally, no wheezing, rales,rhonchi or crepitation. No use of accessory muscles of respiration.  CARDIOVASCULAR: S1, S2 normal. No murmurs, rubs, or gallops.  ABDOMEN: Soft, nontender, nondistended. Bowel sounds present. No organomegaly or mass.  EXTREMITIES: No pedal edema, cyanosis, or clubbing.  NEUROLOGIC: Cranial nerves II through XII are intact. Muscle strength 5/5 in all extremities. Sensation intact. Gait not checked.  PSYCHIATRIC: The patient is alert and oriented x 3.  SKIN: No obvious rash, lesion, or ulcer.   Data Review     CBC w Diff: Lab Results  Component Value Date   WBC 7.0 04/29/2016   HGB 13.5 04/29/2016   HCT 38.6 04/29/2016   PLT 109 (L) 04/29/2016   CMP: Lab Results  Component Value Date   NA 136 04/27/2016   K 4.1 04/27/2016   CL 106 04/27/2016   CO2 23 04/27/2016   BUN 16 04/27/2016  CREATININE 0.89 04/27/2016   PROT 7.2 10/02/2015   ALBUMIN 3.8 10/02/2015   BILITOT 1.0 10/02/2015   ALKPHOS 78 10/02/2015   AST 17 10/02/2015   ALT 13 (L) 10/02/2015  .  Micro Results No results found for this or any previous visit (from the past 240 hour(s)).      Code Status Orders        Start     Ordered   04/29/16 1018  Full code  Continuous     04/29/16 1017    Code Status History    Date Active Date Inactive Code Status Order ID Comments User Context   04/27/2016  6:38 PM 04/28/2016  2:38 PM Full Code [id number]  Ramonita Lab, MD Inpatient    Advance Directive Documentation   Flowsheet  Row Most Recent Value  Type of Advance Directive  -- [does not have charted on wrong patient]  Pre-existing out of facility DNR order (yellow form or pink MOST form)  No data  "MOST" Form in Place?  No data          Follow-up Information    pcp Follow up in 7 day(s).   Why:  You will need to call for an appointment, ccs          Discharge Medications     Medication List    TAKE these medications   aspirin 325 MG EC tablet Take 1 tablet (325 mg total) by mouth daily.   dicyclomine 10 MG capsule Commonly known as:  BENTYL Take 1 capsule (10 mg total) by mouth 3 (three) times daily before meals.   etodolac 200 MG capsule Commonly known as:  LODINE Take 1 capsule (200 mg total) by mouth every 8 (eight) hours.   isosorbide mononitrate 30 MG 24 hr tablet Commonly known as:  IMDUR Take 1 tablet (30 mg total) by mouth daily.   lisinopril 10 MG tablet Commonly known as:  PRINIVIL,ZESTRIL Take 10 mg by mouth daily.   metFORMIN 500 MG tablet Commonly known as:  GLUCOPHAGE Take 500 mg by mouth 2 (two) times daily with a meal.   omega-3 acid ethyl esters 1 g capsule Commonly known as:  LOVAZA Take 1 capsule (1 g total) by mouth 2 (two) times daily.          Total Time in preparing paper work, data evaluation and todays exam - 35 minutes  Auburn Bilberry M.D on 04/29/2016 at 1:31 PM  [facility]   Office  [phone]

## 2016-04-29 NOTE — Care Management (Signed)
Application given for Medication Management and [facility]. No other needs identified.

## 2016-04-29 NOTE — Progress Notes (Signed)
Patient in room status post heart cath through right groin access. No signs of bleeding or hematoma noted. Will continue to monitor. VSS no complaints of pain.

## 2016-04-30 ENCOUNTER — Encounter: Payer: Self-pay | Admitting: Cardiology

## 2016-05-28 ENCOUNTER — Ambulatory Visit: Payer: Self-pay

## 2016-06-03 ENCOUNTER — Ambulatory Visit: Payer: Self-pay

## 2017-09-11 IMAGING — CR DG CHEST 2V
2 series · 2 of 2 positions shown · non-contrast
Comparison: 07/18/2010

CLINICAL DATA: Chest pain

EXAM:
CHEST  2 VIEW

[chest pa]
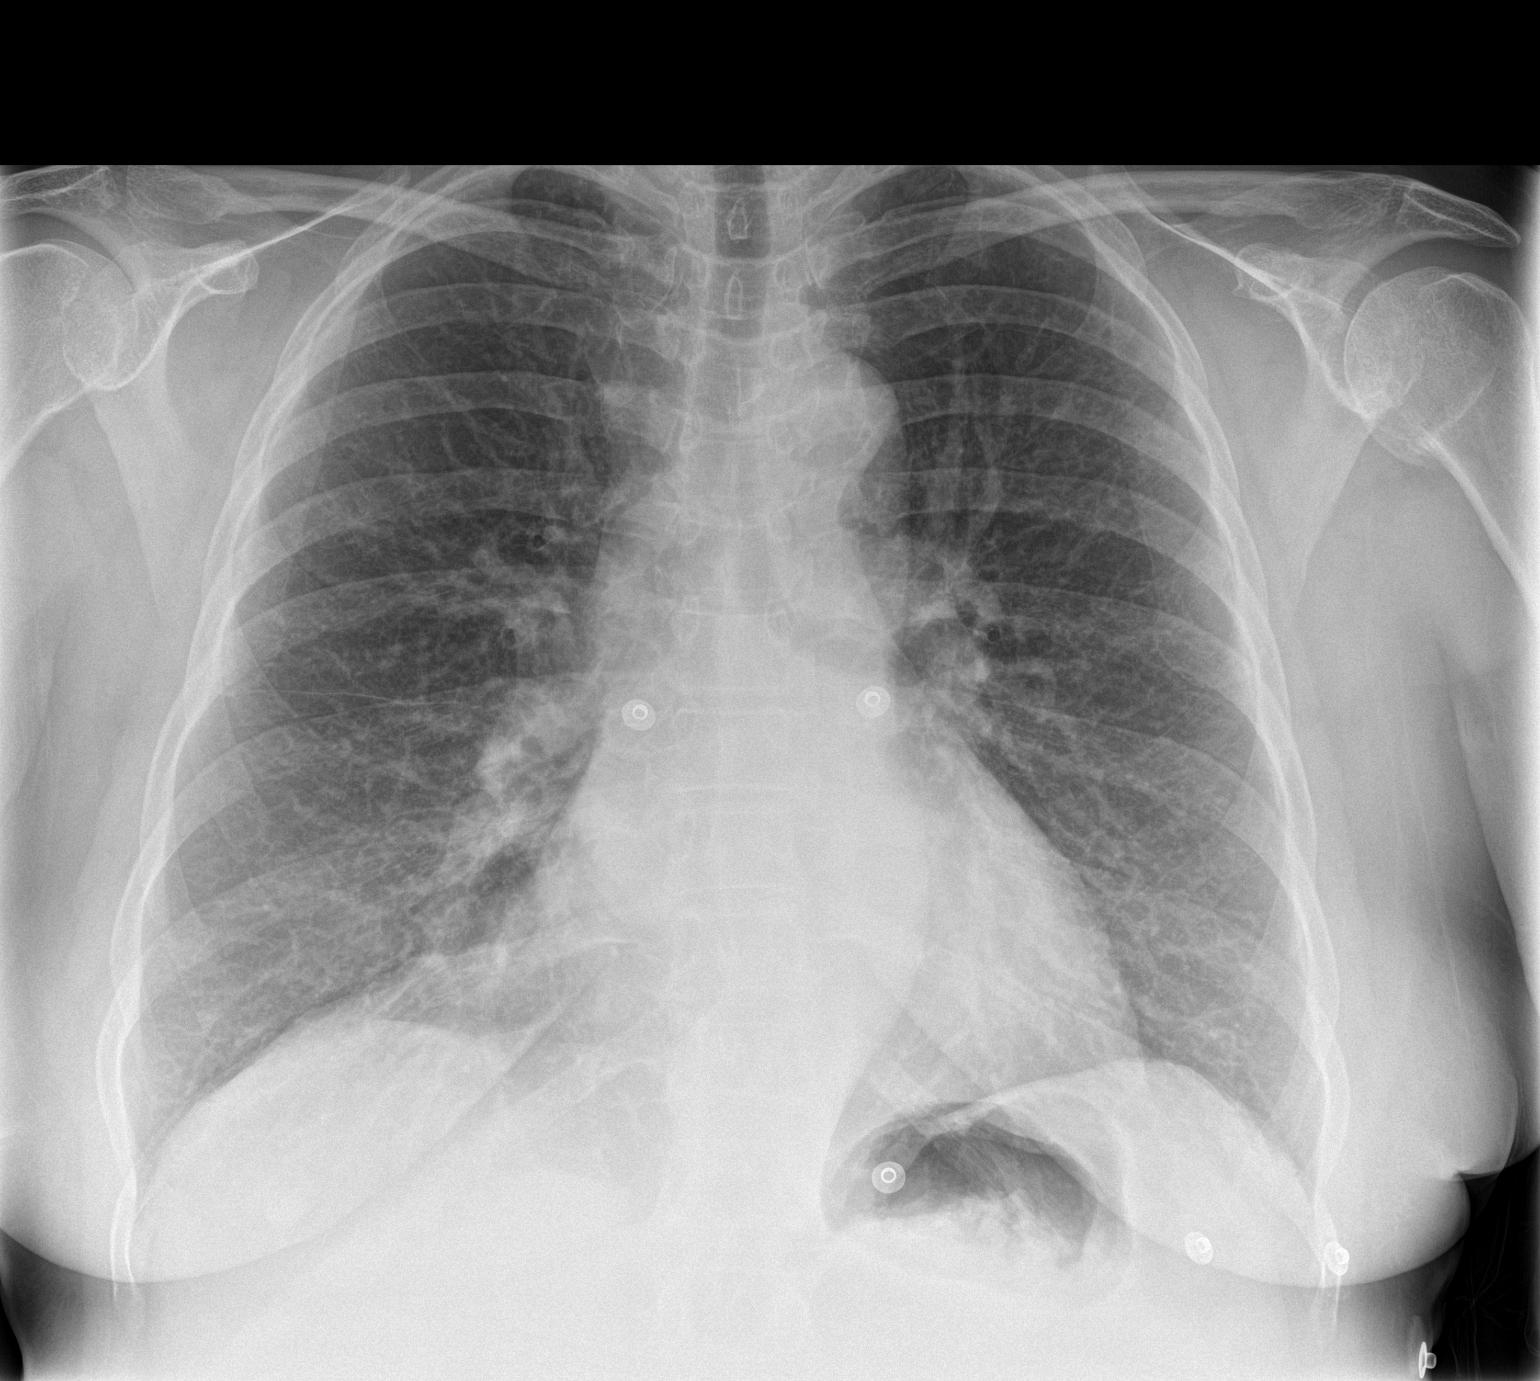

[chest lat]
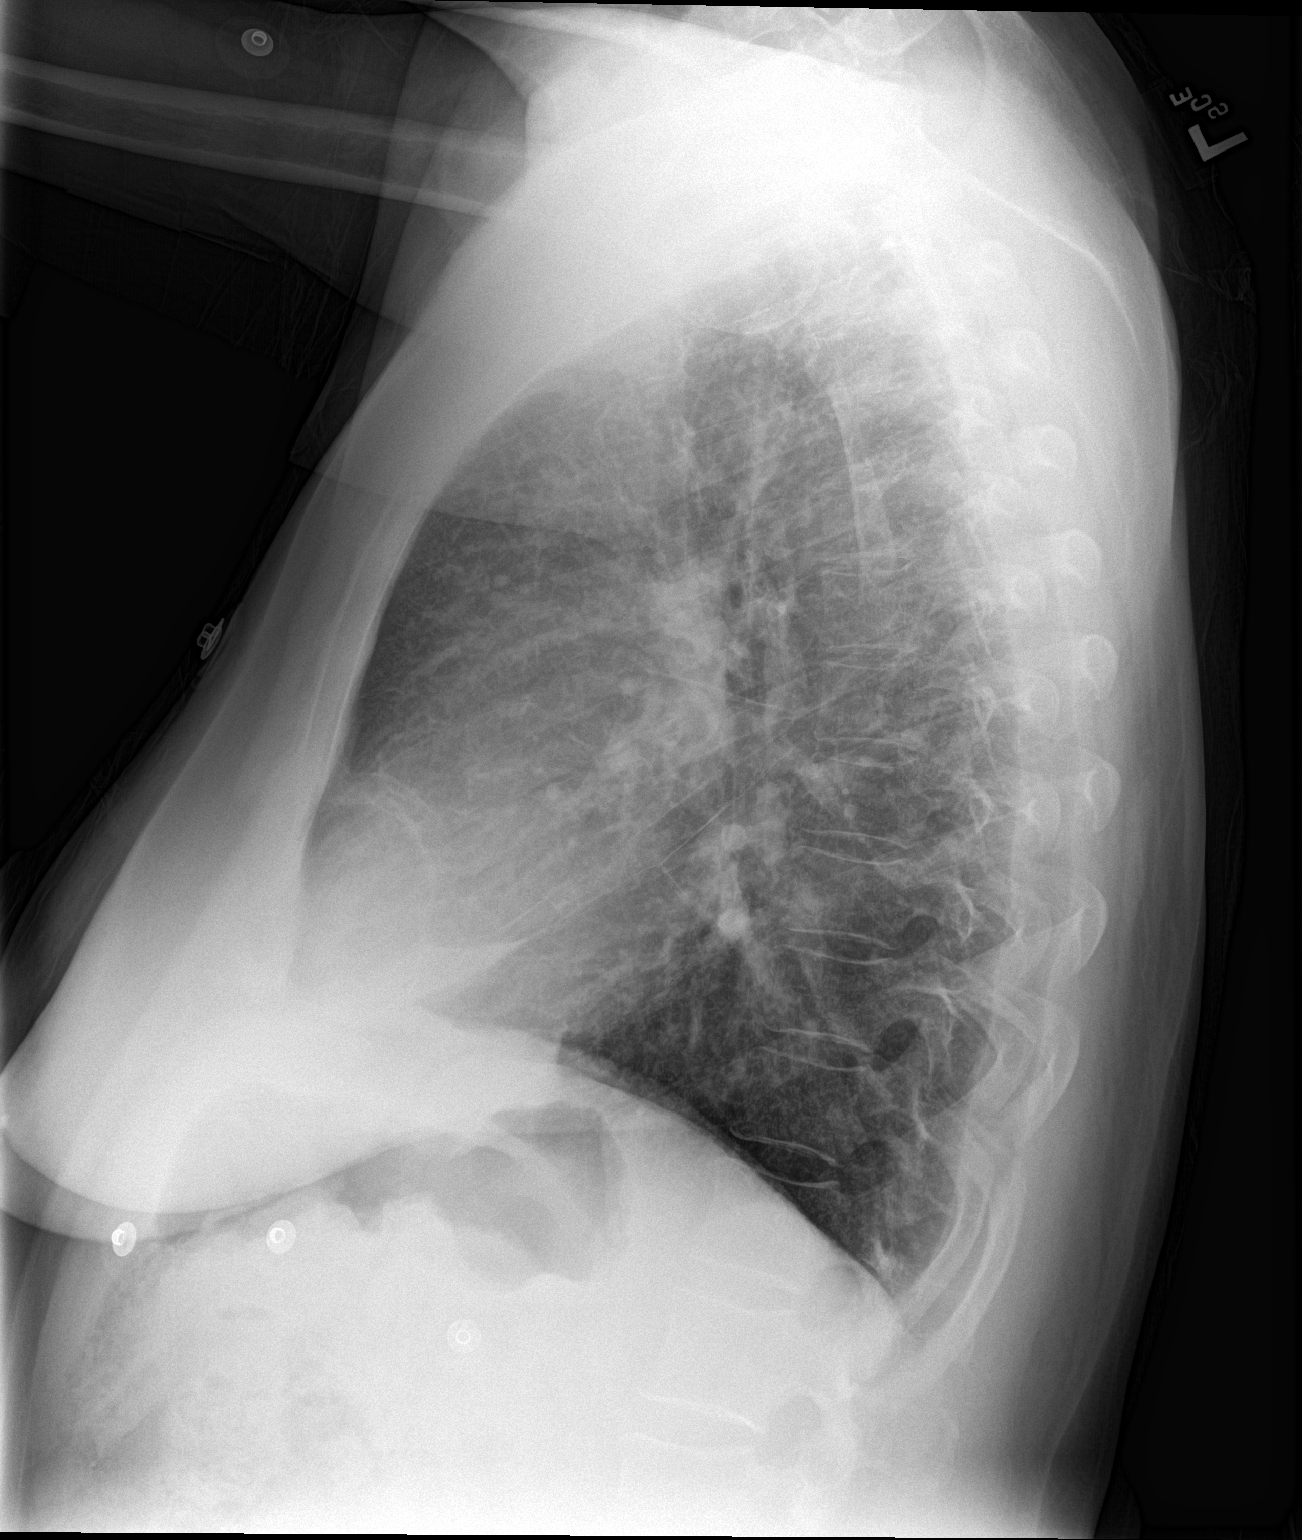

[2 of 2 positions shown; findings below may reference images not displayed]

FINDINGS: The heart size and mediastinal contours are within normal limits.
Both lungs are clear. The visualized skeletal structures are
unremarkable.
IMPRESSION: No active cardiopulmonary disease.

## 2017-10-03 IMAGING — US US ABDOMEN LIMITED
1 series · 14 of 25 positions shown · non-contrast
Comparison: 08/13/2011

CLINICAL DATA: Abdominal pain

EXAM:
US ABDOMEN LIMITED - RIGHT UPPER QUADRANT

[Series 1: us abdomen limited · 0.19mm/px · 14 of 39 slices shown]
[im 1/39]
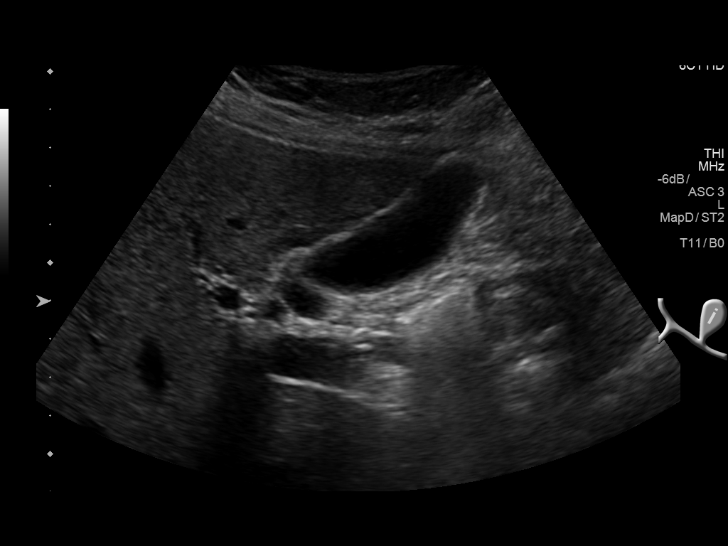
[im 4/39]
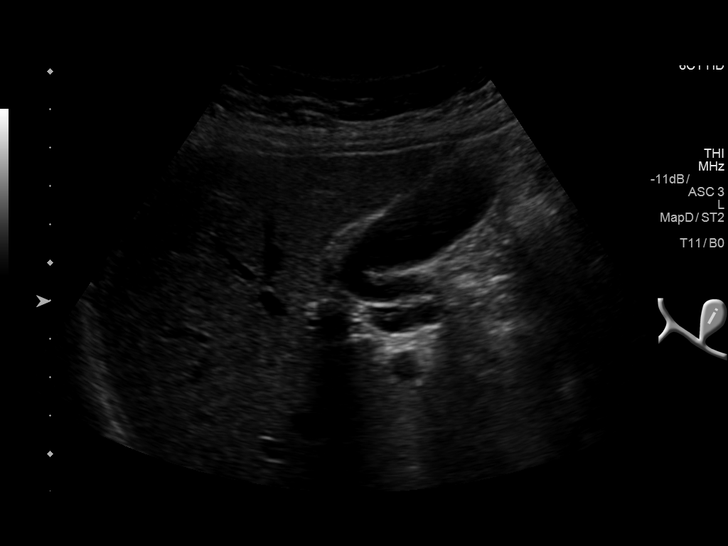
[im 7/39]
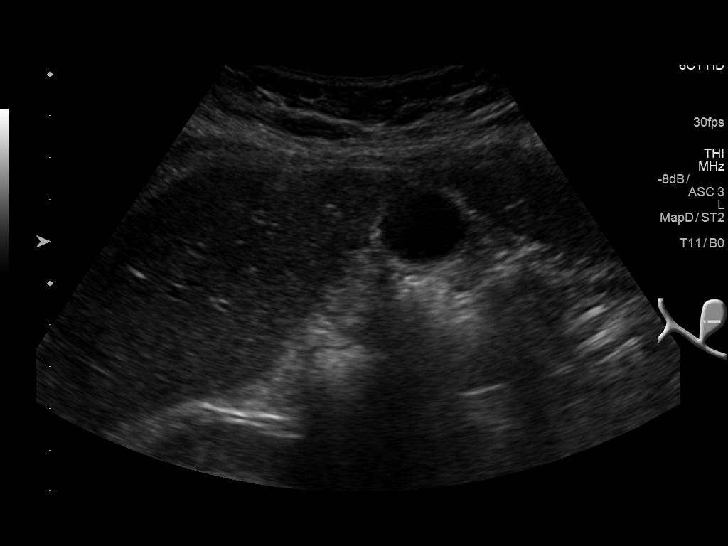
[im 10/39]
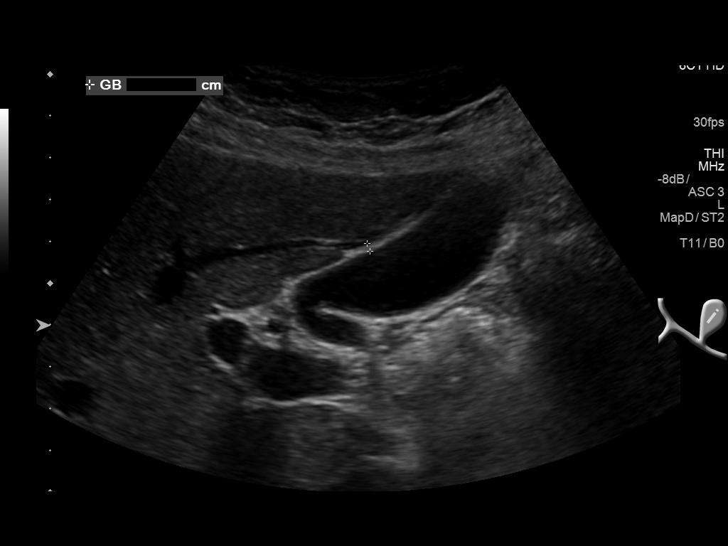
[im 13/39]
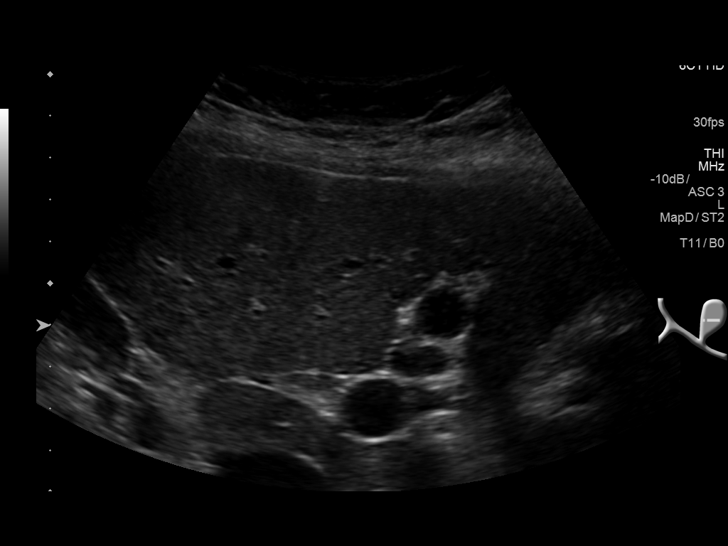
[im 15/39]
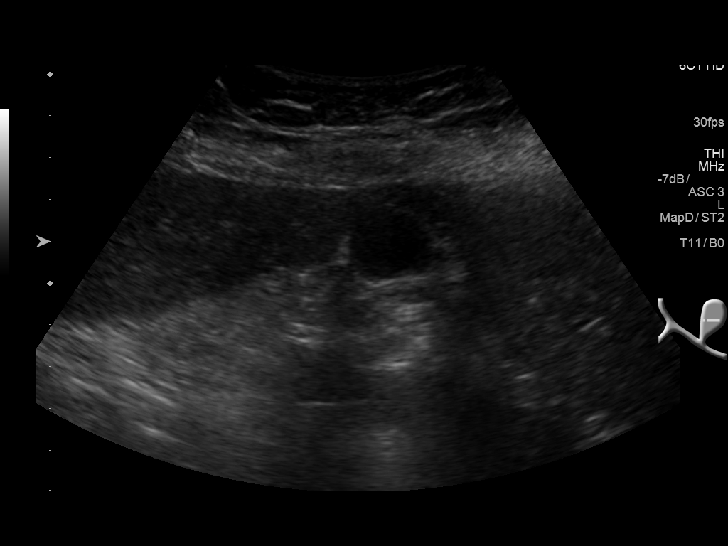
[im 18/39]
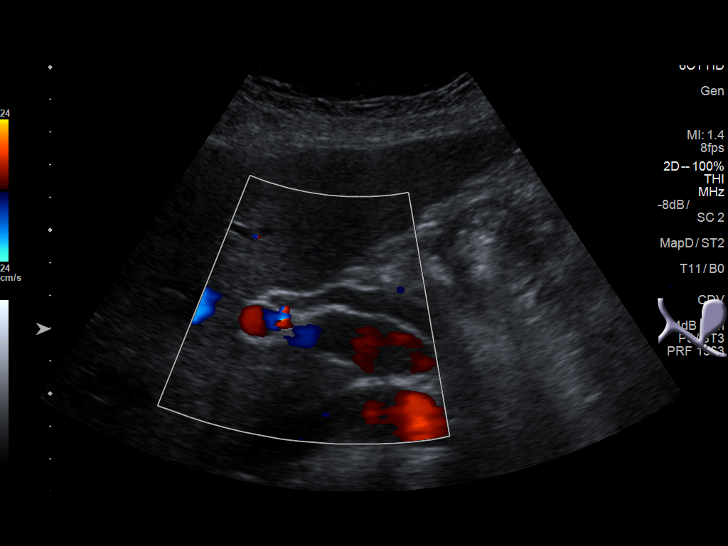
[im 21/39]
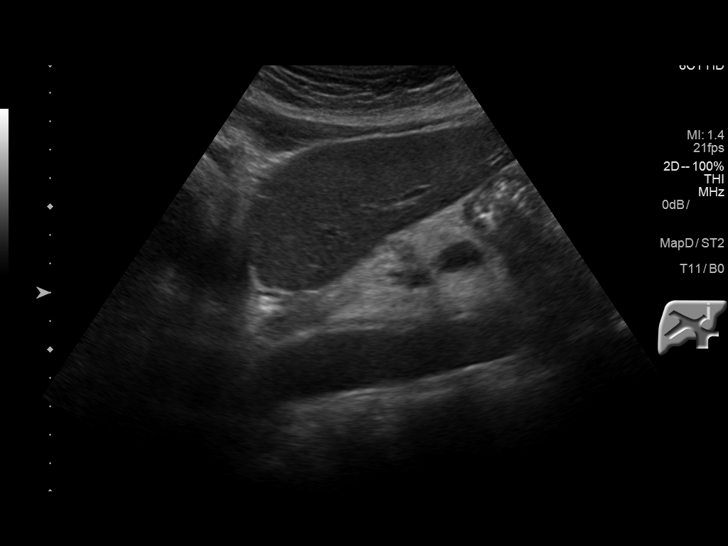
[im 24/39]
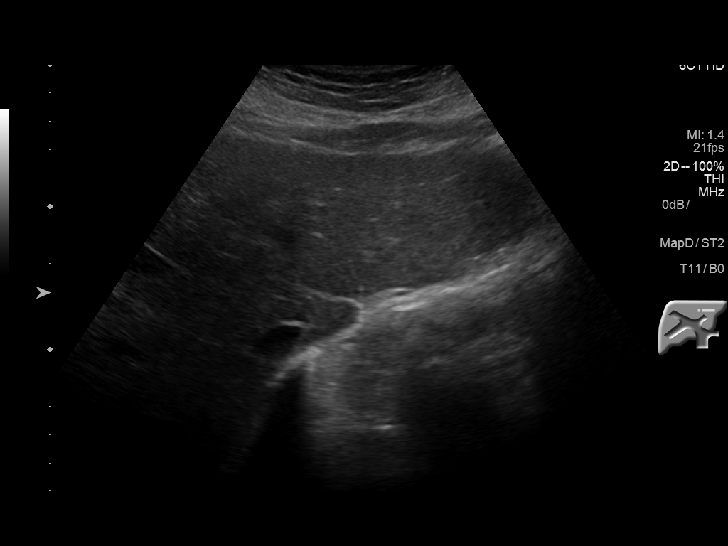
[im 26/39]
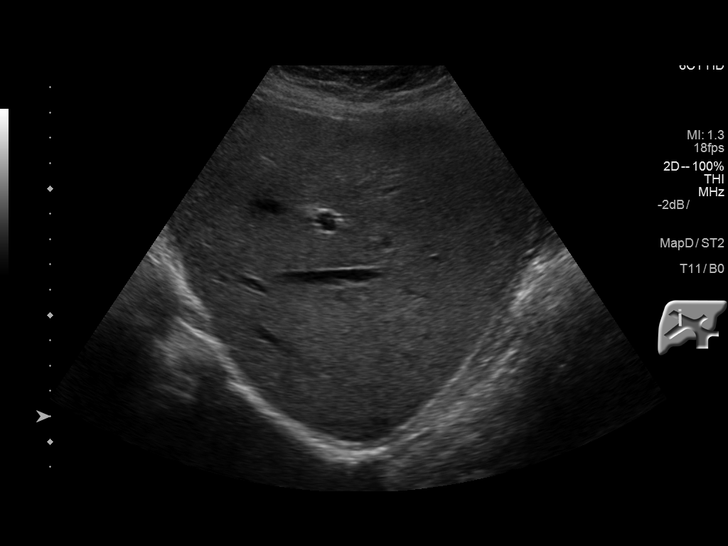
[im 29/39]
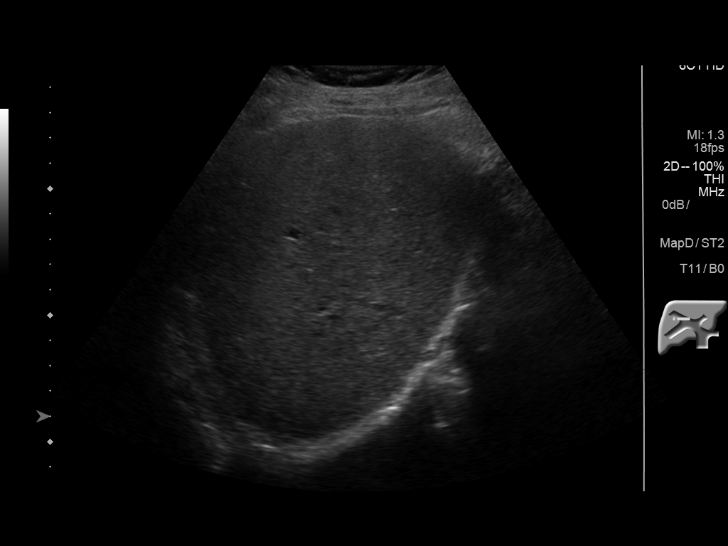
[im 32/39]
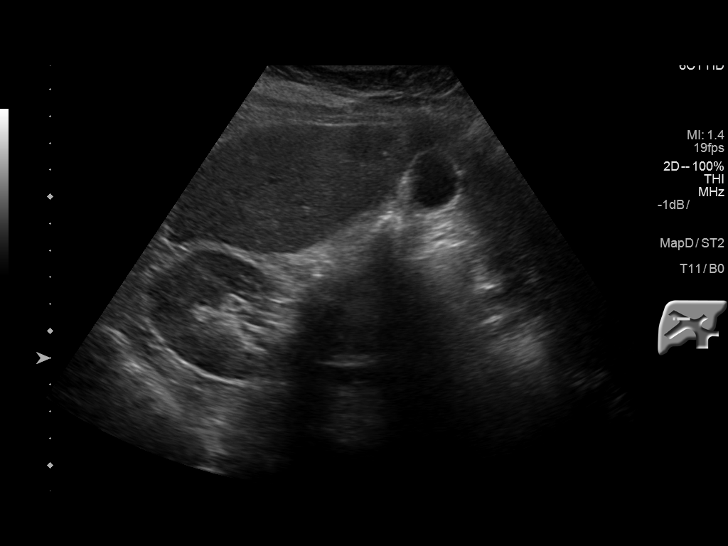
[im 35/39]
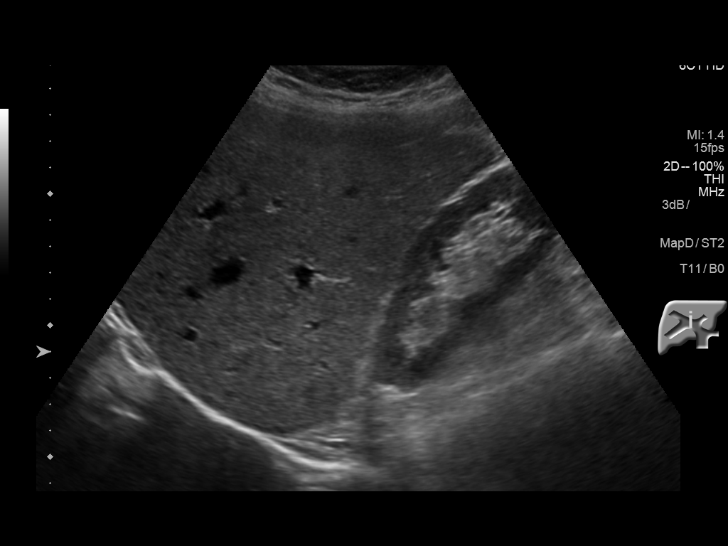
[im 39/39]
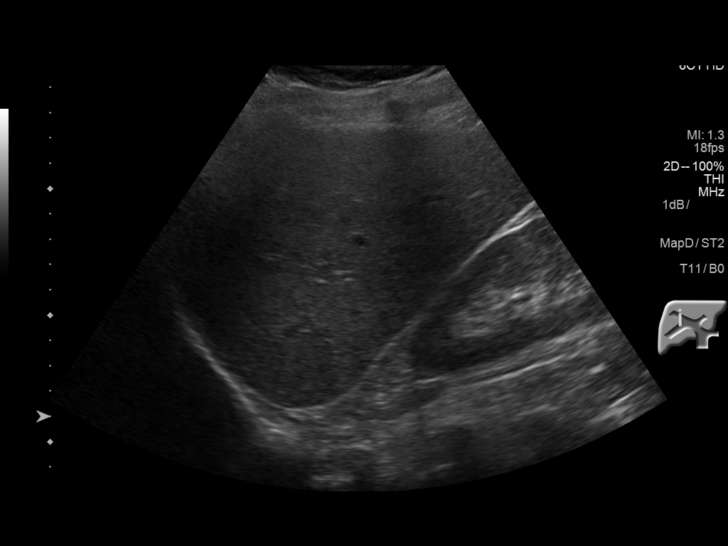

[14 of 25 positions shown; findings below may reference images not displayed]

FINDINGS: Gallbladder:

No gallstones or wall thickening visualized. No sonographic Murphy
sign noted by sonographer.

Common bile duct:

Diameter: 5 to 6 mm, stable.  Where visualized, no filling defect.

Liver:

No focal lesion identified. Within normal limits in parenchymal
echogenicity. Antegrade flow in the imaged portal venous system.
IMPRESSION: Negative right upper quadrant ultrasound.  No explanation for pain.

## 2018-03-14 ENCOUNTER — Encounter: Payer: Self-pay | Admitting: Internal Medicine

## 2018-04-04 ENCOUNTER — Ambulatory Visit: Payer: Self-pay | Admitting: Internal Medicine

## 2018-04-18 ENCOUNTER — Ambulatory Visit: Payer: Self-pay | Admitting: Internal Medicine

## 2018-08-12 ENCOUNTER — Encounter
Admit: 2018-08-12 | Discharge: 2018-08-12 | Disposition: A | Discharge status: Home / Self Care | Payer: PRIVATE HEALTH INSURANCE | Attending: Family

## 2020-10-19 ENCOUNTER — Ambulatory Visit
Admit: 2020-10-19 | Discharge: 2020-10-25 | Disposition: A | Discharge status: Home / Self Care | Payer: PRIVATE HEALTH INSURANCE

## 2020-10-25 MED ORDER — BUPRENORPHINE 2 MG-NALOXONE 0.5 MG SUBLINGUAL TABLET
ORAL_TABLET | SUBLINGUAL | 0 refills | 0.00000 days | Status: CP
Start: 2020-10-25 — End: 2020-10-25

## 2020-10-25 MED ORDER — NITROGLYCERIN 0.4 MG SUBLINGUAL TABLET
ORAL_TABLET | SUBLINGUAL | 0 refills | 1 days | Status: CP | PRN
Start: 2020-10-25 — End: 2021-10-25
  Filled 2020-10-25: qty 25, 7d supply, fill #0

## 2020-10-25 MED ORDER — NICOTINE (POLACRILEX) 4 MG BUCCAL LOZENGE
BUCCAL | 0 refills | 3.00000 days | Status: CP | PRN
Start: 2020-10-25 — End: 2020-11-24
  Filled 2020-10-25: qty 72, 3d supply, fill #0

## 2020-10-25 MED ORDER — OXYCODONE 10 MG TABLET
ORAL_TABLET | Freq: Four times a day (QID) | ORAL | 0 refills | 4 days | Status: CP
Start: 2020-10-25 — End: 2020-10-29
  Filled 2020-10-25: qty 16, 4d supply, fill #0

## 2020-10-25 MED ORDER — CARVEDILOL 12.5 MG TABLET
ORAL_TABLET | Freq: Two times a day (BID) | ORAL | 0 refills | 90 days | Status: CP
Start: 2020-10-25 — End: 2021-01-23
  Filled 2020-10-25: qty 180, 90d supply, fill #0

## 2020-10-26 ENCOUNTER — Encounter: Admit: 2020-10-26 | Discharge: 2020-10-27 | Payer: PRIVATE HEALTH INSURANCE

## 2020-10-26 MED ORDER — METFORMIN 500 MG TABLET
ORAL_TABLET | Freq: Two times a day (BID) | ORAL | 0 refills | 90 days | Status: CP
Start: 2020-10-26 — End: 2021-01-24
  Filled 2020-10-25: qty 180, 90d supply, fill #0

## 2020-10-26 MED ORDER — ONDANSETRON HCL 4 MG TABLET
ORAL_TABLET | Freq: Three times a day (TID) | ORAL | 0 refills | 7.00000 days | Status: CP | PRN
Start: 2020-10-26 — End: 2020-11-02

## 2020-10-26 MED ORDER — BUPRENORPHINE 8 MG-NALOXONE 2 MG SUBLINGUAL FILM
ORAL_FILM | 0 refills | 0 days | Status: CP
Start: 2020-10-26 — End: ?
  Filled 2020-10-27: qty 8, 4d supply, fill #0

## 2020-10-26 MED ORDER — AMLODIPINE 5 MG TABLET
ORAL_TABLET | Freq: Every day | ORAL | 3 refills | 90.00000 days | Status: CP
Start: 2020-10-26 — End: 2021-10-26
  Filled 2020-10-25: qty 90, 90d supply, fill #0

## 2020-10-26 MED ORDER — LISINOPRIL 40 MG TABLET
ORAL_TABLET | Freq: Every day | ORAL | 0 refills | 90 days | Status: CP
Start: 2020-10-26 — End: 2021-01-24
  Filled 2020-10-25: qty 90, 90d supply, fill #0

## 2020-10-26 MED ORDER — ATORVASTATIN 80 MG TABLET
ORAL_TABLET | Freq: Every day | ORAL | 3 refills | 90 days | Status: CP
Start: 2020-10-26 — End: 2021-10-26
  Filled 2020-10-25: qty 90, 90d supply, fill #0

## 2020-10-26 MED ORDER — NICOTINE 21 MG/24 HR DAILY TRANSDERMAL PATCH
MEDICATED_PATCH | Freq: Every day | TRANSDERMAL | 0 refills | 28 days | Status: CP
Start: 2020-10-26 — End: ?
  Filled 2020-10-25: qty 28, 28d supply, fill #0

## 2020-10-26 MED ORDER — CLOPIDOGREL 75 MG TABLET
ORAL_TABLET | Freq: Every day | ORAL | 3 refills | 90 days | Status: CP
Start: 2020-10-26 — End: 2021-10-26
  Filled 2020-10-25: qty 90, 90d supply, fill #0

## 2020-10-26 MED ORDER — ASPIRIN 81 MG CHEWABLE TABLET
ORAL_TABLET | Freq: Every day | ORAL | 3 refills | 108 days | Status: CP
Start: 2020-10-26 — End: 2021-10-26
  Filled 2020-10-25: qty 72, 72d supply, fill #0

## 2020-10-26 MED ORDER — BUPRENORPHINE 2 MG-NALOXONE 0.5 MG SUBLINGUAL FILM
ORAL_FILM | Freq: Two times a day (BID) | SUBLINGUAL | 0 refills | 2.00000 days | Status: CP
Start: 2020-10-26 — End: 2020-10-25
  Filled 2020-10-25: qty 2, 2d supply, fill #0

## 2020-10-26 MED ORDER — ISOSORBIDE MONONITRATE ER 60 MG TABLET,EXTENDED RELEASE 24 HR
ORAL_TABLET | Freq: Every day | ORAL | 0 refills | 90 days | Status: CP
Start: 2020-10-26 — End: 2021-01-24
  Filled 2020-10-25: qty 90, 90d supply, fill #0

## 2020-11-02 ENCOUNTER — Encounter: Admit: 2020-11-02 | Discharge: 2020-11-03 | Payer: PRIVATE HEALTH INSURANCE

## 2020-11-02 MED ORDER — BUPRENORPHINE 8 MG-NALOXONE 2 MG SUBLINGUAL FILM
ORAL_FILM | 0 refills | 0 days | Status: CP
Start: 2020-11-02 — End: ?

## 2020-11-03 ENCOUNTER — Encounter: Admit: 2020-11-03 | Discharge: 2020-11-04 | Payer: PRIVATE HEALTH INSURANCE

## 2020-11-03 DIAGNOSIS — E1169 Type 2 diabetes mellitus with other specified complication: Principal | ICD-10-CM

## 2020-11-03 DIAGNOSIS — F1920 Other psychoactive substance dependence, uncomplicated: Principal | ICD-10-CM

## 2020-11-03 DIAGNOSIS — M791 Myalgia, unspecified site: Principal | ICD-10-CM

## 2020-11-03 DIAGNOSIS — I10 Essential (primary) hypertension: Principal | ICD-10-CM

## 2020-11-03 DIAGNOSIS — J449 Chronic obstructive pulmonary disease, unspecified: Principal | ICD-10-CM

## 2020-11-03 DIAGNOSIS — F17200 Nicotine dependence, unspecified, uncomplicated: Principal | ICD-10-CM

## 2020-11-03 MED ORDER — LANCETS
11 refills | 0.00000 days | Status: CP
Start: 2020-11-03 — End: 2021-11-03

## 2020-11-03 MED ORDER — BLOOD GLUCOSE TEST STRIPS
ORAL_STRIP | 11 refills | 0.00000 days | Status: CP
Start: 2020-11-03 — End: 2021-11-03

## 2020-11-03 MED ORDER — BLOOD-GLUCOSE METER KIT WRAPPER
11 refills | 0 days | Status: CP
Start: 2020-11-03 — End: 2021-11-03

## 2020-11-03 MED ORDER — ALBUTEROL SULFATE HFA 90 MCG/ACTUATION AEROSOL INHALER
Freq: Four times a day (QID) | RESPIRATORY_TRACT | 0 refills | 0 days | Status: CP | PRN
Start: 2020-11-03 — End: 2021-11-03
  Filled 2021-01-23: qty 8.5, 25d supply, fill #0

## 2020-11-03 MED ORDER — ROSUVASTATIN 10 MG TABLET
ORAL_TABLET | Freq: Every evening | ORAL | 3 refills | 90.00000 days | Status: CP
Start: 2020-11-03 — End: 2021-11-03
  Filled 2020-11-03: qty 90, 90d supply, fill #0

## 2020-11-09 ENCOUNTER — Encounter: Admit: 2020-11-09 | Discharge: 2020-11-10 | Payer: PRIVATE HEALTH INSURANCE

## 2020-11-09 MED ORDER — BUPRENORPHINE 8 MG-NALOXONE 2 MG SUBLINGUAL FILM
ORAL_FILM | 0 refills | 0 days | Status: CP
Start: 2020-11-09 — End: ?
  Filled 2020-11-10: qty 28, 14d supply, fill #0

## 2020-11-20 ENCOUNTER — Ambulatory Visit
Admit: 2020-11-20 | Discharge: 2020-11-21 | Payer: PRIVATE HEALTH INSURANCE | Attending: Adult Health | Primary: Adult Health

## 2020-11-20 DIAGNOSIS — E785 Hyperlipidemia, unspecified: Principal | ICD-10-CM

## 2020-11-20 DIAGNOSIS — F17200 Nicotine dependence, unspecified, uncomplicated: Principal | ICD-10-CM

## 2020-11-20 DIAGNOSIS — I10 Essential (primary) hypertension: Principal | ICD-10-CM

## 2020-11-20 DIAGNOSIS — I2510 Atherosclerotic heart disease of native coronary artery without angina pectoris: Principal | ICD-10-CM

## 2020-11-20 DIAGNOSIS — I739 Peripheral vascular disease, unspecified: Principal | ICD-10-CM

## 2020-11-20 MED ORDER — LANCETS
11 refills | 0 days | Status: CP
Start: 2020-11-20 — End: 2021-11-20
  Filled 2020-11-21: qty 100, 90d supply, fill #0

## 2020-11-20 MED ORDER — LISINOPRIL 40 MG TABLET
ORAL_TABLET | Freq: Every day | ORAL | 1 refills | 90 days | Status: CP
Start: 2020-11-20 — End: 2021-02-18
  Filled 2021-01-23: qty 90, 90d supply, fill #0

## 2020-11-20 MED ORDER — ROSUVASTATIN 20 MG TABLET
ORAL_TABLET | Freq: Every evening | ORAL | 3 refills | 90.00000 days | Status: CP
Start: 2020-11-20 — End: 2020-11-20
  Filled 2020-11-21: qty 90, 90d supply, fill #0

## 2020-11-20 MED ORDER — ISOSORBIDE MONONITRATE ER 60 MG TABLET,EXTENDED RELEASE 24 HR
ORAL_TABLET | Freq: Every day | ORAL | 1 refills | 90 days | Status: CP
Start: 2020-11-20 — End: 2021-02-18
  Filled 2021-01-23: qty 90, 90d supply, fill #0

## 2020-11-20 MED ORDER — BLOOD-GLUCOSE METER
11 refills | 0 days | Status: CP
Start: 2020-11-20 — End: ?
  Filled 2020-11-21: qty 1, 30d supply, fill #0

## 2020-11-20 MED ORDER — CARVEDILOL 25 MG TABLET
ORAL_TABLET | Freq: Two times a day (BID) | ORAL | 1 refills | 90 days | Status: CP
Start: 2020-11-20 — End: 2021-02-18
  Filled 2020-11-21: qty 180, 90d supply, fill #0

## 2020-11-20 MED ORDER — CLOPIDOGREL 75 MG TABLET
ORAL_TABLET | Freq: Every day | ORAL | 3 refills | 90 days | Status: CP
Start: 2020-11-20 — End: 2021-11-20
  Filled 2021-01-23: qty 90, 90d supply, fill #0

## 2020-11-20 MED ORDER — AMLODIPINE 5 MG TABLET
ORAL_TABLET | Freq: Every day | ORAL | 3 refills | 90 days | Status: CP
Start: 2020-11-20 — End: 2021-11-20

## 2020-11-20 MED ORDER — ACCU-CHEK GUIDE TEST STRIPS
11 refills | 0 days | Status: CP
Start: 2020-11-20 — End: ?
  Filled 2020-11-21: qty 50, 50d supply, fill #0

## 2020-11-21 DIAGNOSIS — I2510 Atherosclerotic heart disease of native coronary artery without angina pectoris: Principal | ICD-10-CM

## 2020-11-23 ENCOUNTER — Encounter: Admit: 2020-11-23 | Discharge: 2020-11-24 | Payer: PRIVATE HEALTH INSURANCE

## 2020-11-23 MED ORDER — BUPRENORPHINE 8 MG-NALOXONE 2 MG SUBLINGUAL TABLET
ORAL_TABLET | Freq: Every day | SUBLINGUAL | 0 refills | 0 days | Status: CP
Start: 2020-11-23 — End: ?
  Filled 2020-11-24: qty 36, 18d supply, fill #0

## 2020-12-04 ENCOUNTER — Ambulatory Visit: Admit: 2020-12-04

## 2020-12-05 ENCOUNTER — Ambulatory Visit: Payer: Self-pay

## 2020-12-08 ENCOUNTER — Ambulatory Visit: Admit: 2020-12-08 | Discharge: 2020-12-08

## 2020-12-08 DIAGNOSIS — F17200 Nicotine dependence, unspecified, uncomplicated: Principal | ICD-10-CM

## 2020-12-08 DIAGNOSIS — E1169 Type 2 diabetes mellitus with other specified complication: Principal | ICD-10-CM

## 2020-12-08 DIAGNOSIS — I739 Peripheral vascular disease, unspecified: Principal | ICD-10-CM

## 2020-12-08 DIAGNOSIS — H538 Other visual disturbances: Principal | ICD-10-CM

## 2020-12-08 DIAGNOSIS — Z1211 Encounter for screening for malignant neoplasm of colon: Principal | ICD-10-CM

## 2020-12-08 DIAGNOSIS — Z1239 Encounter for other screening for malignant neoplasm of breast: Principal | ICD-10-CM

## 2020-12-08 MED ORDER — NICOTINE INHALER MOUTHPIECE
Freq: Once | RESPIRATORY_TRACT | 0 refills | 1.00000 days | Status: CP
Start: 2020-12-08 — End: 2020-12-08
  Filled 2020-12-11: qty 168, 14d supply, fill #0

## 2020-12-08 MED ORDER — NICOTINE 10 MG INHALATION CARTRIDGE
RESPIRATORY_TRACT | 1 refills | 14.00000 days | Status: CP | PRN
Start: 2020-12-08 — End: 2021-01-07

## 2020-12-08 MED ORDER — AMLODIPINE 5 MG TABLET
ORAL_TABLET | Freq: Every day | ORAL | 3 refills | 90 days | Status: CP
Start: 2020-12-08 — End: 2021-12-08
  Filled 2020-12-11: qty 60, 30d supply, fill #0

## 2020-12-08 MED ORDER — METFORMIN ER 500 MG TABLET,EXTENDED RELEASE 24 HR
ORAL_TABLET | Freq: Every day | ORAL | 3 refills | 90.00000 days | Status: CP
Start: 2020-12-08 — End: 2021-12-08
  Filled 2020-12-11: qty 60, 30d supply, fill #0

## 2020-12-12 DIAGNOSIS — F1120 Opioid dependence, uncomplicated: Principal | ICD-10-CM

## 2020-12-12 MED ORDER — BUPRENORPHINE 8 MG-NALOXONE 2 MG SUBLINGUAL TABLET
ORAL_TABLET | Freq: Two times a day (BID) | SUBLINGUAL | 0 refills | 14 days | Status: CP
Start: 2020-12-12 — End: 2020-12-26
  Filled 2020-12-13: qty 28, 14d supply, fill #0

## 2020-12-13 ENCOUNTER — Ambulatory Visit: Admit: 2020-12-13

## 2020-12-26 ENCOUNTER — Ambulatory Visit: Admit: 2020-12-26 | Discharge: 2020-12-27

## 2020-12-26 MED ORDER — BUPRENORPHINE 8 MG-NALOXONE 2 MG SUBLINGUAL TABLET
ORAL_TABLET | Freq: Two times a day (BID) | SUBLINGUAL | 0 refills | 14.00000 days | Status: CP
Start: 2020-12-26 — End: 2021-01-09

## 2020-12-29 DIAGNOSIS — E1169 Type 2 diabetes mellitus with other specified complication: Principal | ICD-10-CM

## 2020-12-29 MED ORDER — EMPAGLIFLOZIN 10 MG TABLET
ORAL_TABLET | Freq: Every day | ORAL | 3 refills | 90 days | Status: CP
Start: 2020-12-29 — End: ?

## 2021-01-02 MED ORDER — CANAGLIFLOZIN 100 MG TABLET
ORAL_TABLET | Freq: Every day | ORAL | 3 refills | 0 days | Status: CP
Start: 2021-01-02 — End: ?

## 2021-01-05 MED ORDER — BUPRENORPHINE 8 MG-NALOXONE 2 MG SUBLINGUAL TABLET
ORAL_TABLET | Freq: Two times a day (BID) | SUBLINGUAL | 0 refills | 4 days | Status: CP
Start: 2021-01-05 — End: 2021-01-09
  Filled 2021-01-06: qty 8, 4d supply, fill #0

## 2021-01-08 ENCOUNTER — Encounter
Admit: 2021-01-08 | Discharge: 2021-01-09 | Payer: PRIVATE HEALTH INSURANCE | Attending: Internal Medicine | Primary: Internal Medicine

## 2021-01-08 DIAGNOSIS — I10 Essential (primary) hypertension: Principal | ICD-10-CM

## 2021-01-08 DIAGNOSIS — F1920 Other psychoactive substance dependence, uncomplicated: Principal | ICD-10-CM

## 2021-01-08 DIAGNOSIS — F1120 Opioid dependence, uncomplicated: Principal | ICD-10-CM

## 2021-01-08 DIAGNOSIS — K047 Periapical abscess without sinus: Principal | ICD-10-CM

## 2021-01-08 MED ORDER — AMOXICILLIN 875 MG-POTASSIUM CLAVULANATE 125 MG TABLET
ORAL_TABLET | Freq: Two times a day (BID) | ORAL | 0 refills | 7 days | Status: CP
Start: 2021-01-08 — End: 2021-01-15
  Filled 2021-01-09: qty 14, 7d supply, fill #0

## 2021-01-08 MED ORDER — NALOXONE 4 MG/ACTUATION NASAL SPRAY
PRN refills | 0 days | Status: CP
Start: 2021-01-08 — End: ?
  Filled 2021-01-09: qty 2, 1d supply, fill #0

## 2021-01-08 MED ORDER — BUPRENORPHINE 8 MG-NALOXONE 2 MG SUBLINGUAL TABLET
ORAL_TABLET | 0 refills | 30 days | Status: CP
Start: 2021-01-08 — End: 2021-02-07
  Filled 2021-01-09: qty 67, 26d supply, fill #0

## 2021-01-15 DIAGNOSIS — E1169 Type 2 diabetes mellitus with other specified complication: Principal | ICD-10-CM

## 2021-01-23 MED FILL — AMLODIPINE 5 MG TABLET: ORAL | 30 days supply | Qty: 60 | Fill #1

## 2021-01-24 ENCOUNTER — Ambulatory Visit: Admit: 2021-01-24 | Payer: PRIVATE HEALTH INSURANCE

## 2021-02-07 ENCOUNTER — Ambulatory Visit
Admit: 2021-02-07 | Discharge: 2021-02-08 | Payer: PRIVATE HEALTH INSURANCE | Attending: Student in an Organized Health Care Education/Training Program | Primary: Student in an Organized Health Care Education/Training Program

## 2021-02-07 DIAGNOSIS — Z1211 Encounter for screening for malignant neoplasm of colon: Principal | ICD-10-CM

## 2021-02-07 DIAGNOSIS — F1120 Opioid dependence, uncomplicated: Principal | ICD-10-CM

## 2021-02-07 MED ORDER — BUPRENORPHINE 8 MG-NALOXONE 2 MG SUBLINGUAL TABLET
ORAL_TABLET | SUBLINGUAL | 0 refills | 30.00000 days | Status: CP
Start: 2021-02-07 — End: 2021-03-09
  Filled 2021-02-07: qty 67, 30d supply, fill #0

## 2021-02-07 MED FILL — ROSUVASTATIN 20 MG TABLET: ORAL | 90 days supply | Qty: 90 | Fill #1

## 2021-02-23 MED FILL — CARVEDILOL 25 MG TABLET: ORAL | 90 days supply | Qty: 180 | Fill #1

## 2021-03-07 ENCOUNTER — Encounter
Admit: 2021-03-07 | Discharge: 2021-03-08 | Payer: PRIVATE HEALTH INSURANCE | Attending: Student in an Organized Health Care Education/Training Program | Primary: Student in an Organized Health Care Education/Training Program

## 2021-03-07 DIAGNOSIS — I10 Essential (primary) hypertension: Principal | ICD-10-CM

## 2021-03-07 DIAGNOSIS — F17200 Nicotine dependence, unspecified, uncomplicated: Principal | ICD-10-CM

## 2021-03-07 DIAGNOSIS — F1120 Opioid dependence, uncomplicated: Principal | ICD-10-CM

## 2021-03-07 MED ORDER — NICOTINE 21 MG/24 HR DAILY TRANSDERMAL PATCH
MEDICATED_PATCH | TRANSDERMAL | 0 refills | 56.00000 days | Status: CP
Start: 2021-03-07 — End: 2021-05-02
  Filled 2021-03-08: qty 56, 56d supply, fill #0

## 2021-03-07 MED ORDER — BUPRENORPHINE 8 MG-NALOXONE 2 MG SUBLINGUAL TABLET
ORAL_TABLET | SUBLINGUAL | 0 refills | 30.00000 days | Status: CP
Start: 2021-03-07 — End: 2021-04-06
  Filled 2021-03-08: qty 67, 30d supply, fill #0

## 2021-03-27 ENCOUNTER — Encounter: Admit: 2021-03-27 | Discharge: 2021-03-28 | Payer: PRIVATE HEALTH INSURANCE

## 2021-03-30 DIAGNOSIS — E1169 Type 2 diabetes mellitus with other specified complication: Principal | ICD-10-CM

## 2021-03-30 MED ORDER — TRULICITY 0.75 MG/0.5 ML SUBCUTANEOUS PEN INJECTOR
SUBCUTANEOUS | 5 refills | 28.00000 days | Status: CP
Start: 2021-03-30 — End: ?
  Filled 2021-04-09: qty 2, 28d supply, fill #0

## 2021-04-02 ENCOUNTER — Ambulatory Visit: Admit: 2021-04-02 | Payer: PRIVATE HEALTH INSURANCE | Attending: Retina Specialist | Primary: Retina Specialist

## 2021-04-09 MED FILL — AMLODIPINE 5 MG TABLET: ORAL | 30 days supply | Qty: 60 | Fill #2

## 2021-04-12 ENCOUNTER — Encounter
Admit: 2021-04-12 | Discharge: 2021-04-12 | Payer: PRIVATE HEALTH INSURANCE | Attending: Student in an Organized Health Care Education/Training Program | Primary: Student in an Organized Health Care Education/Training Program

## 2021-04-12 ENCOUNTER — Encounter: Admit: 2021-04-12 | Discharge: 2021-04-12 | Payer: PRIVATE HEALTH INSURANCE

## 2021-04-12 DIAGNOSIS — I10 Essential (primary) hypertension: Principal | ICD-10-CM

## 2021-04-12 DIAGNOSIS — F17200 Nicotine dependence, unspecified, uncomplicated: Principal | ICD-10-CM

## 2021-04-12 DIAGNOSIS — Z124 Encounter for screening for malignant neoplasm of cervix: Principal | ICD-10-CM

## 2021-04-12 DIAGNOSIS — K5900 Constipation, unspecified: Principal | ICD-10-CM

## 2021-04-12 DIAGNOSIS — F419 Anxiety disorder, unspecified: Principal | ICD-10-CM

## 2021-04-12 DIAGNOSIS — E1169 Type 2 diabetes mellitus with other specified complication: Principal | ICD-10-CM

## 2021-04-12 DIAGNOSIS — F1121 Opioid dependence, in remission: Principal | ICD-10-CM

## 2021-04-12 DIAGNOSIS — I2510 Atherosclerotic heart disease of native coronary artery without angina pectoris: Principal | ICD-10-CM

## 2021-04-12 DIAGNOSIS — J449 Chronic obstructive pulmonary disease, unspecified: Principal | ICD-10-CM

## 2021-04-12 DIAGNOSIS — F1120 Opioid dependence, uncomplicated: Principal | ICD-10-CM

## 2021-04-12 MED ORDER — CITALOPRAM 10 MG TABLET
ORAL_TABLET | Freq: Every day | ORAL | 2 refills | 30 days | Status: CP
Start: 2021-04-12 — End: 2022-04-12
  Filled 2021-04-18: qty 30, 30d supply, fill #0

## 2021-04-12 MED ORDER — BUPRENORPHINE 8 MG-NALOXONE 2 MG SUBLINGUAL TABLET
ORAL_TABLET | 0 refills | 0 days | Status: CP
Start: 2021-04-12 — End: 2021-05-16
  Filled 2021-04-18: qty 68, 30d supply, fill #0

## 2021-04-12 MED ORDER — BENAZEPRIL 20 MG-HYDROCHLOROTHIAZIDE 25 MG TABLET
ORAL_TABLET | Freq: Every day | ORAL | 3 refills | 90.00000 days | Status: CP
Start: 2021-04-12 — End: ?
  Filled 2021-04-18: qty 90, 90d supply, fill #0

## 2021-04-12 MED ORDER — ONDANSETRON HCL 4 MG TABLET
ORAL_TABLET | Freq: Every day | ORAL | 0 refills | 30 days | Status: CP | PRN
Start: 2021-04-12 — End: 2021-05-12
  Filled 2021-04-18: qty 30, 30d supply, fill #0

## 2021-04-12 MED ORDER — OMEPRAZOLE 20 MG CAPSULE,DELAYED RELEASE
ORAL_CAPSULE | Freq: Every day | ORAL | 3 refills | 90.00000 days | Status: CP
Start: 2021-04-12 — End: 2022-04-12
  Filled 2021-04-18: qty 90, 90d supply, fill #0

## 2021-04-12 MED ORDER — METFORMIN ER 500 MG TABLET,EXTENDED RELEASE 24 HR
ORAL_TABLET | Freq: Every day | ORAL | 3 refills | 90 days | Status: CP
Start: 2021-04-12 — End: 2022-04-12

## 2021-04-20 ENCOUNTER — Ambulatory Visit
Admit: 2021-04-20 | Discharge: 2021-04-21 | Payer: PRIVATE HEALTH INSURANCE | Attending: Student in an Organized Health Care Education/Training Program | Primary: Student in an Organized Health Care Education/Training Program

## 2021-04-20 DIAGNOSIS — K047 Periapical abscess without sinus: Principal | ICD-10-CM

## 2021-04-20 DIAGNOSIS — K0889 Other specified disorders of teeth and supporting structures: Principal | ICD-10-CM

## 2021-04-20 MED ORDER — AMOXICILLIN 875 MG-POTASSIUM CLAVULANATE 125 MG TABLET
ORAL_TABLET | Freq: Two times a day (BID) | ORAL | 0 refills | 5.00000 days | Status: CP
Start: 2021-04-20 — End: 2021-04-25
  Filled 2021-04-20: qty 10, 5d supply, fill #0

## 2021-04-20 MED ORDER — IBUPROFEN 800 MG TABLET
ORAL_TABLET | Freq: Three times a day (TID) | ORAL | 0 refills | 5 days | Status: CP
Start: 2021-04-20 — End: 2021-04-25
  Filled 2021-04-20: qty 15, 5d supply, fill #0

## 2021-04-20 MED FILL — ISOSORBIDE MONONITRATE ER 60 MG TABLET,EXTENDED RELEASE 24 HR: ORAL | 90 days supply | Qty: 90 | Fill #1

## 2021-04-20 MED FILL — CLOPIDOGREL 75 MG TABLET: ORAL | 90 days supply | Qty: 90 | Fill #1

## 2021-05-04 MED FILL — ROSUVASTATIN 20 MG TABLET: ORAL | 90 days supply | Qty: 90 | Fill #2

## 2021-05-04 MED FILL — AMLODIPINE 5 MG TABLET: ORAL | 30 days supply | Qty: 60 | Fill #3

## 2021-05-04 MED FILL — TRULICITY 0.75 MG/0.5 ML SUBCUTANEOUS PEN INJECTOR: SUBCUTANEOUS | 28 days supply | Qty: 2 | Fill #1

## 2021-05-10 ENCOUNTER — Ambulatory Visit: Admit: 2021-05-10 | Discharge: 2021-05-11 | Payer: PRIVATE HEALTH INSURANCE

## 2021-05-10 DIAGNOSIS — I10 Essential (primary) hypertension: Principal | ICD-10-CM

## 2021-05-10 DIAGNOSIS — I2510 Atherosclerotic heart disease of native coronary artery without angina pectoris: Principal | ICD-10-CM

## 2021-05-10 DIAGNOSIS — F17200 Nicotine dependence, unspecified, uncomplicated: Principal | ICD-10-CM

## 2021-05-10 DIAGNOSIS — E785 Hyperlipidemia, unspecified: Principal | ICD-10-CM

## 2021-05-10 MED ORDER — LISINOPRIL 20 MG TABLET
ORAL_TABLET | Freq: Every day | ORAL | 3 refills | 90.00000 days | Status: CP
Start: 2021-05-10 — End: 2022-05-10
  Filled 2021-05-22: qty 90, 90d supply, fill #0

## 2021-05-15 ENCOUNTER — Ambulatory Visit: Admit: 2021-05-15 | Discharge: 2021-05-16

## 2021-05-15 DIAGNOSIS — E1169 Type 2 diabetes mellitus with other specified complication: Principal | ICD-10-CM

## 2021-05-16 ENCOUNTER — Ambulatory Visit: Admit: 2021-05-16 | Payer: PRIVATE HEALTH INSURANCE | Attending: Internal Medicine | Primary: Internal Medicine

## 2021-05-16 DIAGNOSIS — F1120 Opioid dependence, uncomplicated: Principal | ICD-10-CM

## 2021-05-16 MED ORDER — BUPRENORPHINE 8 MG-NALOXONE 2 MG SUBLINGUAL TABLET
ORAL_TABLET | 0 refills | 0 days | Status: CP
Start: 2021-05-16 — End: 2021-06-19
  Filled 2021-05-22: qty 16, 7d supply, fill #0

## 2021-05-23 MED ORDER — CARVEDILOL 25 MG TABLET
ORAL_TABLET | Freq: Two times a day (BID) | ORAL | 1 refills | 90 days | Status: CP
Start: 2021-05-23 — End: 2021-08-21
  Filled 2021-05-31: qty 180, 90d supply, fill #0

## 2021-05-25 ENCOUNTER — Ambulatory Visit: Admit: 2021-05-25 | Discharge: 2021-05-26 | Payer: PRIVATE HEALTH INSURANCE

## 2021-05-25 DIAGNOSIS — Z5181 Encounter for therapeutic drug level monitoring: Principal | ICD-10-CM

## 2021-05-25 DIAGNOSIS — Z79899 Other long term (current) drug therapy: Principal | ICD-10-CM

## 2021-05-25 DIAGNOSIS — F1120 Opioid dependence, uncomplicated: Principal | ICD-10-CM

## 2021-05-25 DIAGNOSIS — I10 Essential (primary) hypertension: Principal | ICD-10-CM

## 2021-05-25 MED ORDER — LISINOPRIL 20 MG TABLET
ORAL_TABLET | Freq: Every day | ORAL | 3 refills | 45.00000 days | Status: CP
Start: 2021-05-25 — End: ?

## 2021-05-25 MED ORDER — BUPRENORPHINE 8 MG-NALOXONE 2 MG SUBLINGUAL TABLET
ORAL_TABLET | 0 refills | 0 days | Status: CP
Start: 2021-05-25 — End: ?
  Filled 2021-05-31: qty 48, 24d supply, fill #0

## 2021-05-31 MED FILL — ACCU-CHEK SOFTCLIX LANCETS: 90 days supply | Qty: 100 | Fill #1

## 2021-05-31 MED FILL — CITALOPRAM 10 MG TABLET: ORAL | 30 days supply | Qty: 30 | Fill #1

## 2021-05-31 MED FILL — TRULICITY 0.75 MG/0.5 ML SUBCUTANEOUS PEN INJECTOR: SUBCUTANEOUS | 28 days supply | Qty: 2 | Fill #2

## 2021-06-18 ENCOUNTER — Ambulatory Visit: Admit: 2021-06-18

## 2021-07-19 DIAGNOSIS — I10 Essential (primary) hypertension: Principal | ICD-10-CM

## 2021-07-19 MED ORDER — CLOPIDOGREL 75 MG TABLET
ORAL_TABLET | Freq: Every day | ORAL | 0 refills | 30 days | Status: CP
Start: 2021-07-19 — End: 2022-07-19
  Filled 2021-07-28: qty 30, 30d supply, fill #0

## 2021-07-19 MED ORDER — LISINOPRIL 20 MG TABLET
ORAL_TABLET | Freq: Every day | ORAL | 0 refills | 30.00000 days | Status: CP
Start: 2021-07-19 — End: ?
  Filled 2021-07-28: qty 60, 30d supply, fill #0

## 2021-07-23 MED ORDER — BUPRENORPHINE 8 MG-NALOXONE 2 MG SUBLINGUAL TABLET
ORAL_TABLET | Freq: Two times a day (BID) | SUBLINGUAL | 0 refills | 14 days | Status: CP
Start: 2021-07-23 — End: 2021-08-06
  Filled 2021-07-30: qty 28, 14d supply, fill #0

## 2021-08-06 DIAGNOSIS — E1169 Type 2 diabetes mellitus with other specified complication: Principal | ICD-10-CM

## 2021-08-06 MED ORDER — TRULICITY 0.75 MG/0.5 ML SUBCUTANEOUS PEN INJECTOR
SUBCUTANEOUS | 5 refills | 28 days | Status: CP
Start: 2021-08-06 — End: ?

## 2021-08-08 ENCOUNTER — Institutional Professional Consult (permissible substitution): Admit: 2021-08-08 | Discharge: 2021-08-09 | Payer: MEDICAID | Attending: Internal Medicine | Primary: Internal Medicine

## 2021-08-08 DIAGNOSIS — K047 Periapical abscess without sinus: Principal | ICD-10-CM

## 2021-08-08 DIAGNOSIS — F1120 Opioid dependence, uncomplicated: Principal | ICD-10-CM

## 2021-08-08 MED ORDER — BUPRENORPHINE 8 MG-NALOXONE 2 MG SUBLINGUAL TABLET
ORAL_TABLET | Freq: Two times a day (BID) | SUBLINGUAL | 0 refills | 35.00000 days | Status: CP
Start: 2021-08-08 — End: 2021-09-12
  Filled 2021-08-16: qty 10, 5d supply, fill #0

## 2021-08-08 MED ORDER — AMOXICILLIN 875 MG-POTASSIUM CLAVULANATE 125 MG TABLET
ORAL_TABLET | Freq: Two times a day (BID) | ORAL | 0 refills | 10 days | Status: CP
Start: 2021-08-08 — End: 2021-08-18
  Filled 2021-08-16: qty 20, 10d supply, fill #0

## 2021-08-10 ENCOUNTER — Institutional Professional Consult (permissible substitution)
Admit: 2021-08-10 | Discharge: 2021-08-11 | Payer: MEDICAID | Attending: Pharmacist Clinician (PhC)/ Clinical Pharmacy Specialist | Primary: Pharmacist Clinician (PhC)/ Clinical Pharmacy Specialist

## 2021-08-20 ENCOUNTER — Institutional Professional Consult (permissible substitution)
Admit: 2021-08-20 | Discharge: 2021-08-21 | Attending: Pharmacist Clinician (PhC)/ Clinical Pharmacy Specialist | Primary: Pharmacist Clinician (PhC)/ Clinical Pharmacy Specialist

## 2021-08-22 MED FILL — BUPRENORPHINE 8 MG-NALOXONE 2 MG SUBLINGUAL TABLET: SUBLINGUAL | 10 days supply | Qty: 20 | Fill #1

## 2021-08-28 DIAGNOSIS — I10 Essential (primary) hypertension: Principal | ICD-10-CM

## 2021-08-28 MED ORDER — CLOPIDOGREL 75 MG TABLET
ORAL_TABLET | Freq: Every day | ORAL | 3 refills | 90 days | Status: CP
Start: 2021-08-28 — End: 2022-08-28
  Filled 2021-09-01: qty 30, 30d supply, fill #0

## 2021-08-28 MED ORDER — LISINOPRIL 20 MG TABLET
ORAL_TABLET | Freq: Every day | ORAL | 3 refills | 90 days | Status: CP
Start: 2021-08-28 — End: ?
  Filled 2021-09-01: qty 60, 30d supply, fill #0

## 2021-08-28 MED ORDER — ROSUVASTATIN 20 MG TABLET
ORAL_TABLET | Freq: Every evening | ORAL | 3 refills | 90 days | Status: CP
Start: 2021-08-28 — End: 2022-08-28
  Filled 2021-09-01: qty 30, 30d supply, fill #0

## 2021-09-05 ENCOUNTER — Ambulatory Visit: Admit: 2021-09-05 | Discharge: 2021-09-06 | Payer: MEDICAID

## 2021-09-05 DIAGNOSIS — I2510 Atherosclerotic heart disease of native coronary artery without angina pectoris: Principal | ICD-10-CM

## 2021-09-05 DIAGNOSIS — J449 Chronic obstructive pulmonary disease, unspecified: Principal | ICD-10-CM

## 2021-09-05 DIAGNOSIS — E1169 Type 2 diabetes mellitus with other specified complication: Principal | ICD-10-CM

## 2021-09-05 DIAGNOSIS — Z23 Encounter for immunization: Principal | ICD-10-CM

## 2021-09-05 DIAGNOSIS — F1121 Opioid dependence, in remission: Principal | ICD-10-CM

## 2021-09-05 DIAGNOSIS — E1342 Other specified diabetes mellitus with diabetic polyneuropathy: Principal | ICD-10-CM

## 2021-09-05 MED ORDER — NITROGLYCERIN 0.4 MG SUBLINGUAL TABLET
ORAL_TABLET | SUBLINGUAL | 0 refills | 1 days | Status: CP | PRN
Start: 2021-09-05 — End: 2022-09-05
  Filled 2021-09-10: qty 25, 8d supply, fill #0

## 2021-09-05 MED ORDER — AMITRIPTYLINE 10 MG TABLET
ORAL_TABLET | Freq: Every evening | ORAL | 3 refills | 270.00000 days | Status: CP
Start: 2021-09-05 — End: 2022-09-05
  Filled 2021-09-10: qty 30, 30d supply, fill #0

## 2021-09-05 MED ORDER — TRULICITY 0.75 MG/0.5 ML SUBCUTANEOUS PEN INJECTOR
SUBCUTANEOUS | 5 refills | 28.00000 days | Status: CP
Start: 2021-09-05 — End: ?
  Filled 2021-09-18: qty 2, 28d supply, fill #0

## 2021-09-05 MED ORDER — ACCU-CHEK GUIDE TEST STRIPS
SUBCUTANEOUS | 11 refills | 0.00000 days | Status: CP
Start: 2021-09-05 — End: 2022-09-05
  Filled 2021-09-10: qty 100, 90d supply, fill #0

## 2021-09-05 MED ORDER — METFORMIN 500 MG TABLET
ORAL_TABLET | Freq: Every day | ORAL | 0 refills | 90.00000 days | Status: CP
Start: 2021-09-05 — End: ?

## 2021-09-05 MED FILL — BUPRENORPHINE 8 MG-NALOXONE 2 MG SUBLINGUAL TABLET: SUBLINGUAL | 20 days supply | Qty: 40 | Fill #2

## 2021-09-12 MED ORDER — METFORMIN 500 MG TABLET
ORAL_TABLET | Freq: Every day | ORAL | 3 refills | 90 days | Status: CP
Start: 2021-09-12 — End: ?
  Filled 2021-09-18: qty 60, 30d supply, fill #0

## 2021-09-18 MED FILL — CARVEDILOL 25 MG TABLET: ORAL | 30 days supply | Qty: 60 | Fill #1

## 2021-09-26 ENCOUNTER — Ambulatory Visit
Admit: 2021-09-26 | Discharge: 2021-09-27 | Payer: PRIVATE HEALTH INSURANCE | Attending: Adult Health | Primary: Adult Health

## 2021-09-26 ENCOUNTER — Ambulatory Visit
Admit: 2021-09-26 | Discharge: 2021-09-27 | Payer: PRIVATE HEALTH INSURANCE | Attending: Internal Medicine | Primary: Internal Medicine

## 2021-09-26 DIAGNOSIS — G4700 Insomnia, unspecified: Principal | ICD-10-CM

## 2021-09-26 DIAGNOSIS — F17200 Nicotine dependence, unspecified, uncomplicated: Principal | ICD-10-CM

## 2021-09-26 DIAGNOSIS — E785 Hyperlipidemia, unspecified: Principal | ICD-10-CM

## 2021-09-26 DIAGNOSIS — I10 Essential (primary) hypertension: Principal | ICD-10-CM

## 2021-09-26 DIAGNOSIS — F1121 Opioid dependence, in remission: Principal | ICD-10-CM

## 2021-09-26 DIAGNOSIS — F1120 Opioid dependence, uncomplicated: Principal | ICD-10-CM

## 2021-09-26 DIAGNOSIS — I739 Peripheral vascular disease, unspecified: Principal | ICD-10-CM

## 2021-09-26 MED ORDER — CARVEDILOL 25 MG TABLET
ORAL_TABLET | Freq: Two times a day (BID) | ORAL | 3 refills | 90.00000 days | Status: CP
Start: 2021-09-26 — End: ?
  Filled 2021-10-29: qty 60, 30d supply, fill #0

## 2021-09-26 MED ORDER — ISOSORBIDE MONONITRATE ER 60 MG TABLET,EXTENDED RELEASE 24 HR
ORAL_TABLET | Freq: Every day | ORAL | 3 refills | 90 days | Status: CP
Start: 2021-09-26 — End: ?
  Filled 2021-09-26: qty 30, 30d supply, fill #0

## 2021-09-26 MED ORDER — BUPRENORPHINE 8 MG-NALOXONE 2 MG SUBLINGUAL TABLET
ORAL_TABLET | Freq: Two times a day (BID) | SUBLINGUAL | 0 refills | 28 days | Status: CP
Start: 2021-09-26 — End: 2021-10-24
  Filled 2021-09-26: qty 56, 28d supply, fill #0

## 2021-10-01 MED ORDER — AMITRIPTYLINE 10 MG TABLET
ORAL_TABLET | Freq: Every evening | ORAL | 3 refills | 90 days | Status: CP
Start: 2021-10-01 — End: 2022-10-01
  Filled 2021-10-02: qty 90, 30d supply, fill #0

## 2021-10-02 MED FILL — LISINOPRIL 20 MG TABLET: ORAL | 30 days supply | Qty: 60 | Fill #1

## 2021-10-02 MED FILL — CLOPIDOGREL 75 MG TABLET: ORAL | 30 days supply | Qty: 30 | Fill #1

## 2021-10-03 ENCOUNTER — Ambulatory Visit: Admit: 2021-10-03 | Payer: PRIVATE HEALTH INSURANCE

## 2021-10-10 MED ORDER — ROSUVASTATIN 20 MG TABLET
ORAL_TABLET | Freq: Every evening | ORAL | 3 refills | 90.00000 days | Status: CP
Start: 2021-10-10 — End: 2022-10-10
  Filled 2021-10-17: qty 30, 30d supply, fill #0

## 2021-10-17 ENCOUNTER — Ambulatory Visit: Admit: 2021-10-17 | Discharge: 2021-10-18 | Payer: PRIVATE HEALTH INSURANCE

## 2021-10-22 ENCOUNTER — Ambulatory Visit
Admit: 2021-10-22 | Payer: PRIVATE HEALTH INSURANCE | Attending: Addiction (Substance Use Disorder) | Primary: Addiction (Substance Use Disorder)

## 2021-10-23 DIAGNOSIS — F1120 Opioid dependence, uncomplicated: Principal | ICD-10-CM

## 2021-10-24 MED ORDER — BUPRENORPHINE 8 MG-NALOXONE 2 MG SUBLINGUAL TABLET
ORAL_TABLET | Freq: Two times a day (BID) | SUBLINGUAL | 0 refills | 14.00000 days | Status: CP
Start: 2021-10-24 — End: 2021-11-07
  Filled 2021-10-29: qty 28, 14d supply, fill #0

## 2021-10-29 MED FILL — LISINOPRIL 20 MG TABLET: ORAL | 30 days supply | Qty: 60 | Fill #2

## 2021-10-29 MED FILL — CLOPIDOGREL 75 MG TABLET: ORAL | 30 days supply | Qty: 30 | Fill #2

## 2021-10-29 MED FILL — METFORMIN 500 MG TABLET: ORAL | 30 days supply | Qty: 60 | Fill #1

## 2021-10-29 MED FILL — TRULICITY 0.75 MG/0.5 ML SUBCUTANEOUS PEN INJECTOR: SUBCUTANEOUS | 28 days supply | Qty: 2 | Fill #1

## 2021-10-30 DIAGNOSIS — I739 Peripheral vascular disease, unspecified: Principal | ICD-10-CM

## 2021-11-11 DIAGNOSIS — F1120 Opioid dependence, uncomplicated: Principal | ICD-10-CM

## 2021-11-12 MED ORDER — BUPRENORPHINE 8 MG-NALOXONE 2 MG SUBLINGUAL TABLET
ORAL_TABLET | Freq: Two times a day (BID) | SUBLINGUAL | 0 refills | 16 days | Status: CP
Start: 2021-11-12 — End: 2021-11-28
  Filled 2021-11-14: qty 32, 16d supply, fill #0

## 2021-11-14 MED FILL — OMEPRAZOLE 20 MG CAPSULE,DELAYED RELEASE: ORAL | 90 days supply | Qty: 90 | Fill #1

## 2021-11-14 MED FILL — AMITRIPTYLINE 10 MG TABLET: ORAL | 30 days supply | Qty: 90 | Fill #1

## 2021-11-14 MED FILL — ROSUVASTATIN 20 MG TABLET: ORAL | 30 days supply | Qty: 30 | Fill #1

## 2021-11-21 ENCOUNTER — Ambulatory Visit
Admit: 2021-11-21 | Discharge: 2021-11-22 | Payer: PRIVATE HEALTH INSURANCE | Attending: Internal Medicine | Primary: Internal Medicine

## 2021-11-21 DIAGNOSIS — F1120 Opioid dependence, uncomplicated: Principal | ICD-10-CM

## 2021-11-21 DIAGNOSIS — Z09 Encounter for follow-up examination after completed treatment for conditions other than malignant neoplasm: Principal | ICD-10-CM

## 2021-11-21 MED ORDER — BUPRENORPHINE 8 MG-NALOXONE 2 MG SUBLINGUAL TABLET
ORAL_TABLET | Freq: Two times a day (BID) | SUBLINGUAL | 0 refills | 28 days | Status: CP
Start: 2021-11-21 — End: 2021-12-19
  Filled 2021-11-26: qty 56, 28d supply, fill #0

## 2021-11-26 MED FILL — TRULICITY 0.75 MG/0.5 ML SUBCUTANEOUS PEN INJECTOR: SUBCUTANEOUS | 28 days supply | Qty: 2 | Fill #2

## 2021-11-30 ENCOUNTER — Ambulatory Visit: Admit: 2021-11-30 | Discharge: 2021-11-30 | Payer: PRIVATE HEALTH INSURANCE

## 2021-11-30 MED ORDER — ASPIRIN 81 MG CHEWABLE TABLET
ORAL_TABLET | Freq: Every day | ORAL | 11 refills | 30 days | Status: CP
Start: 2021-11-30 — End: ?

## 2021-11-30 MED ORDER — CLOPIDOGREL 75 MG TABLET
ORAL_TABLET | Freq: Every day | ORAL | 11 refills | 30 days | Status: CP
Start: 2021-11-30 — End: ?
  Filled 2021-12-08: qty 30, 30d supply, fill #0

## 2021-12-06 ENCOUNTER — Ambulatory Visit
Admit: 2021-12-06 | Payer: PRIVATE HEALTH INSURANCE | Attending: Student in an Organized Health Care Education/Training Program | Primary: Student in an Organized Health Care Education/Training Program

## 2021-12-08 MED FILL — CARVEDILOL 25 MG TABLET: ORAL | 30 days supply | Qty: 60 | Fill #1

## 2021-12-19 MED ORDER — BUPRENORPHINE 8 MG-NALOXONE 2 MG SUBLINGUAL TABLET
ORAL_TABLET | Freq: Two times a day (BID) | SUBLINGUAL | 0 refills | 28 days | Status: CP
Start: 2021-12-19 — End: 2022-01-16
  Filled 2021-12-24: qty 56, 28d supply, fill #0

## 2021-12-24 MED FILL — METFORMIN 500 MG TABLET: ORAL | 30 days supply | Qty: 60 | Fill #2

## 2021-12-24 MED FILL — LISINOPRIL 20 MG TABLET: ORAL | 30 days supply | Qty: 60 | Fill #3

## 2021-12-24 MED FILL — ISOSORBIDE MONONITRATE ER 60 MG TABLET,EXTENDED RELEASE 24 HR: ORAL | 30 days supply | Qty: 30 | Fill #0

## 2022-01-07 MED FILL — ROSUVASTATIN 20 MG TABLET: ORAL | 30 days supply | Qty: 30 | Fill #2

## 2022-01-07 MED FILL — AMITRIPTYLINE 10 MG TABLET: ORAL | 30 days supply | Qty: 90 | Fill #2

## 2022-01-07 MED FILL — CLOPIDOGREL 75 MG TABLET: ORAL | 30 days supply | Qty: 30 | Fill #1

## 2022-01-07 MED FILL — TRULICITY 0.75 MG/0.5 ML SUBCUTANEOUS PEN INJECTOR: SUBCUTANEOUS | 28 days supply | Qty: 2 | Fill #3

## 2022-01-07 MED FILL — CARVEDILOL 25 MG TABLET: ORAL | 30 days supply | Qty: 60 | Fill #2

## 2022-01-09 ENCOUNTER — Ambulatory Visit: Admit: 2022-01-09 | Payer: PRIVATE HEALTH INSURANCE | Attending: Adult Health | Primary: Adult Health

## 2022-01-16 MED ORDER — BUPRENORPHINE 8 MG-NALOXONE 2 MG SUBLINGUAL TABLET
ORAL_TABLET | Freq: Two times a day (BID) | SUBLINGUAL | 0 refills | 15 days | Status: CP
Start: 2022-01-16 — End: 2022-01-31
  Filled 2022-01-22: qty 30, 15d supply, fill #0

## 2022-01-21 MED ORDER — NITROGLYCERIN 0.4 MG SUBLINGUAL TABLET
ORAL_TABLET | SUBLINGUAL | 0 refills | 1 days | Status: CP | PRN
Start: 2022-01-21 — End: 2023-01-21
  Filled 2022-01-22: qty 25, 8d supply, fill #0

## 2022-02-06 ENCOUNTER — Ambulatory Visit: Admit: 2022-02-06 | Payer: PRIVATE HEALTH INSURANCE | Attending: Internal Medicine | Primary: Internal Medicine

## 2022-02-09 MED FILL — CARVEDILOL 25 MG TABLET: ORAL | 30 days supply | Qty: 60 | Fill #3

## 2022-02-09 MED FILL — ROSUVASTATIN 20 MG TABLET: ORAL | 30 days supply | Qty: 30 | Fill #3

## 2022-02-09 MED FILL — TRULICITY 0.75 MG/0.5 ML SUBCUTANEOUS PEN INJECTOR: SUBCUTANEOUS | 28 days supply | Qty: 2 | Fill #4

## 2022-02-09 MED FILL — LISINOPRIL 20 MG TABLET: ORAL | 30 days supply | Qty: 60 | Fill #4

## 2022-02-09 MED FILL — OMEPRAZOLE 20 MG CAPSULE,DELAYED RELEASE: ORAL | 90 days supply | Qty: 90 | Fill #2

## 2022-02-09 MED FILL — ISOSORBIDE MONONITRATE ER 60 MG TABLET,EXTENDED RELEASE 24 HR: ORAL | 30 days supply | Qty: 30 | Fill #1

## 2022-02-09 MED FILL — AMITRIPTYLINE 10 MG TABLET: ORAL | 30 days supply | Qty: 90 | Fill #3

## 2022-02-09 MED FILL — CLOPIDOGREL 75 MG TABLET: ORAL | 30 days supply | Qty: 30 | Fill #2

## 2022-02-25 MED ORDER — BUPRENORPHINE 8 MG-NALOXONE 2 MG SUBLINGUAL TABLET
ORAL_TABLET | Freq: Two times a day (BID) | SUBLINGUAL | 0 refills | 23 days | Status: CP
Start: 2022-02-25 — End: 2022-03-20
  Filled 2022-03-05: qty 46, 23d supply, fill #0

## 2022-03-05 MED FILL — LISINOPRIL 20 MG TABLET: ORAL | 30 days supply | Qty: 60 | Fill #5

## 2022-03-05 MED FILL — ROSUVASTATIN 20 MG TABLET: ORAL | 30 days supply | Qty: 30 | Fill #4

## 2022-03-05 MED FILL — CARVEDILOL 25 MG TABLET: ORAL | 30 days supply | Qty: 60 | Fill #4

## 2022-03-05 MED FILL — CLOPIDOGREL 75 MG TABLET: ORAL | 30 days supply | Qty: 30 | Fill #3

## 2022-03-05 MED FILL — AMITRIPTYLINE 10 MG TABLET: ORAL | 30 days supply | Qty: 90 | Fill #4

## 2022-03-05 MED FILL — ISOSORBIDE MONONITRATE ER 60 MG TABLET,EXTENDED RELEASE 24 HR: ORAL | 30 days supply | Qty: 30 | Fill #2

## 2022-03-28 MED ORDER — NITROGLYCERIN 0.4 MG SUBLINGUAL TABLET
ORAL_TABLET | SUBLINGUAL | 0 refills | 1 days | Status: CP | PRN
Start: 2022-03-28 — End: ?
  Filled 2022-03-29: qty 25, 8d supply, fill #0

## 2022-03-29 MED FILL — ROSUVASTATIN 20 MG TABLET: ORAL | 30 days supply | Qty: 30 | Fill #5

## 2022-03-29 MED FILL — AMITRIPTYLINE 10 MG TABLET: ORAL | 30 days supply | Qty: 90 | Fill #5

## 2022-03-29 MED FILL — CARVEDILOL 25 MG TABLET: ORAL | 30 days supply | Qty: 60 | Fill #5

## 2022-03-29 MED FILL — ASPIRIN 81 MG CHEWABLE TABLET: ORAL | 30 days supply | Qty: 30 | Fill #0

## 2022-03-29 MED FILL — LISINOPRIL 20 MG TABLET: ORAL | 30 days supply | Qty: 60 | Fill #6

## 2022-03-29 MED FILL — CLOPIDOGREL 75 MG TABLET: ORAL | 30 days supply | Qty: 30 | Fill #4

## 2022-03-29 MED FILL — ISOSORBIDE MONONITRATE ER 60 MG TABLET,EXTENDED RELEASE 24 HR: ORAL | 30 days supply | Qty: 30 | Fill #3

## 2022-04-01 MED ORDER — BUPRENORPHINE 8 MG-NALOXONE 2 MG SUBLINGUAL TABLET
ORAL_TABLET | Freq: Two times a day (BID) | SUBLINGUAL | 0 refills | 17 days | Status: CP
Start: 2022-04-01 — End: 2022-04-18
  Filled 2022-04-01: qty 34, 17d supply, fill #0

## 2022-04-16 ENCOUNTER — Ambulatory Visit: Admit: 2022-04-16 | Payer: PRIVATE HEALTH INSURANCE

## 2022-04-17 ENCOUNTER — Ambulatory Visit: Admit: 2022-04-17 | Discharge: 2022-04-17 | Payer: PRIVATE HEALTH INSURANCE

## 2022-04-17 ENCOUNTER — Ambulatory Visit
Admit: 2022-04-17 | Discharge: 2022-04-17 | Payer: PRIVATE HEALTH INSURANCE | Attending: Internal Medicine | Primary: Internal Medicine

## 2022-04-17 DIAGNOSIS — R42 Dizziness and giddiness: Principal | ICD-10-CM

## 2022-04-17 DIAGNOSIS — F1410 Cocaine abuse, uncomplicated: Principal | ICD-10-CM

## 2022-04-17 DIAGNOSIS — E1169 Type 2 diabetes mellitus with other specified complication: Principal | ICD-10-CM

## 2022-04-17 DIAGNOSIS — I10 Essential (primary) hypertension: Principal | ICD-10-CM

## 2022-04-17 DIAGNOSIS — F1120 Opioid dependence, uncomplicated: Principal | ICD-10-CM

## 2022-04-17 MED ORDER — BUPRENORPHINE 8 MG-NALOXONE 2 MG SUBLINGUAL TABLET
ORAL_TABLET | Freq: Two times a day (BID) | SUBLINGUAL | 0 refills | 30 days | Status: CP
Start: 2022-04-17 — End: 2022-05-17
  Filled 2022-04-17: qty 60, 30d supply, fill #0

## 2022-04-17 MED ORDER — LISINOPRIL 20 MG TABLET
ORAL_TABLET | Freq: Every day | ORAL | 3 refills | 90 days | Status: CP
Start: 2022-04-17 — End: ?
  Filled 2022-05-18: qty 30, 30d supply, fill #0

## 2022-04-17 MED ORDER — ISOSORBIDE MONONITRATE ER 30 MG TABLET,EXTENDED RELEASE 24 HR
ORAL_TABLET | Freq: Every day | ORAL | 3 refills | 90 days | Status: CP
Start: 2022-04-17 — End: 2023-04-17
  Filled 2022-04-17: qty 30, 30d supply, fill #0

## 2022-04-17 MED ORDER — AMITRIPTYLINE 10 MG TABLET
ORAL_TABLET | Freq: Every evening | ORAL | 3 refills | 90 days | Status: CP
Start: 2022-04-17 — End: 2023-04-17

## 2022-05-15 ENCOUNTER — Ambulatory Visit
Admit: 2022-05-15 | Discharge: 2022-05-16 | Payer: PRIVATE HEALTH INSURANCE | Attending: Internal Medicine | Primary: Internal Medicine

## 2022-05-15 ENCOUNTER — Ambulatory Visit: Admit: 2022-05-15 | Discharge: 2022-05-16 | Payer: PRIVATE HEALTH INSURANCE

## 2022-05-15 DIAGNOSIS — F1410 Cocaine abuse, uncomplicated: Principal | ICD-10-CM

## 2022-05-15 DIAGNOSIS — R400 Somnolence: Principal | ICD-10-CM

## 2022-05-15 DIAGNOSIS — I10 Essential (primary) hypertension: Principal | ICD-10-CM

## 2022-05-15 DIAGNOSIS — R42 Dizziness and giddiness: Principal | ICD-10-CM

## 2022-05-15 DIAGNOSIS — F4322 Adjustment disorder with anxiety: Principal | ICD-10-CM

## 2022-05-15 DIAGNOSIS — E1169 Type 2 diabetes mellitus with other specified complication: Principal | ICD-10-CM

## 2022-05-15 DIAGNOSIS — N179 Acute kidney failure, unspecified: Principal | ICD-10-CM

## 2022-05-15 DIAGNOSIS — F1121 Opioid dependence, in remission: Principal | ICD-10-CM

## 2022-05-15 DIAGNOSIS — Z1239 Encounter for other screening for malignant neoplasm of breast: Principal | ICD-10-CM

## 2022-05-15 DIAGNOSIS — J3489 Other specified disorders of nose and nasal sinuses: Principal | ICD-10-CM

## 2022-05-15 DIAGNOSIS — R0683 Snoring: Principal | ICD-10-CM

## 2022-05-15 MED ORDER — CARVEDILOL 25 MG TABLET
ORAL_TABLET | Freq: Two times a day (BID) | ORAL | 3 refills | 90 days | Status: CP
Start: 2022-05-15 — End: ?
  Filled 2022-05-18: qty 60, 30d supply, fill #0

## 2022-05-15 MED ORDER — METFORMIN ER 500 MG TABLET,EXTENDED RELEASE 24 HR
ORAL_TABLET | Freq: Every day | ORAL | 3 refills | 90 days | Status: CP
Start: 2022-05-15 — End: 2023-05-15
  Filled 2022-05-18: qty 30, 30d supply, fill #0

## 2022-05-15 MED ORDER — BUSPIRONE 5 MG TABLET
ORAL_TABLET | Freq: Two times a day (BID) | ORAL | 0 refills | 30 days | Status: CP | PRN
Start: 2022-05-15 — End: 2023-05-15
  Filled 2022-05-15: qty 60, 30d supply, fill #0

## 2022-05-15 MED ORDER — CITALOPRAM 10 MG TABLET
ORAL_TABLET | Freq: Every day | ORAL | 2 refills | 30 days | Status: CP
Start: 2022-05-15 — End: 2023-05-15
  Filled 2022-05-15: qty 30, 30d supply, fill #0

## 2022-05-15 MED ORDER — BUPRENORPHINE 8 MG-NALOXONE 2 MG SUBLINGUAL TABLET
ORAL_TABLET | 0 refills | 0 days | Status: CP
Start: 2022-05-15 — End: ?
  Filled 2022-05-15: qty 70, 30d supply, fill #0

## 2022-05-15 MED ORDER — ROSUVASTATIN 20 MG TABLET
ORAL_TABLET | Freq: Every evening | ORAL | 3 refills | 90 days | Status: CP
Start: 2022-05-15 — End: 2023-05-15
  Filled 2022-05-18: qty 30, 30d supply, fill #0

## 2022-05-18 MED FILL — CLOPIDOGREL 75 MG TABLET: ORAL | 30 days supply | Qty: 30 | Fill #5

## 2022-05-18 MED FILL — ASPIRIN 81 MG CHEWABLE TABLET: ORAL | 30 days supply | Qty: 30 | Fill #1

## 2022-05-18 MED FILL — TRULICITY 0.75 MG/0.5 ML SUBCUTANEOUS PEN INJECTOR: SUBCUTANEOUS | 28 days supply | Qty: 2 | Fill #5

## 2022-05-18 MED FILL — ISOSORBIDE MONONITRATE ER 30 MG TABLET,EXTENDED RELEASE 24 HR: ORAL | 30 days supply | Qty: 30 | Fill #0

## 2022-05-24 ENCOUNTER — Ambulatory Visit: Admit: 2022-05-24 | Payer: PRIVATE HEALTH INSURANCE | Attending: Clinical | Primary: Clinical

## 2022-06-18 MED ORDER — BUPRENORPHINE 8 MG-NALOXONE 2 MG SUBLINGUAL TABLET
ORAL_TABLET | 0 refills | 4 days | Status: CP
Start: 2022-06-18 — End: 2022-06-22

## 2022-06-19 DIAGNOSIS — E1169 Type 2 diabetes mellitus with other specified complication: Principal | ICD-10-CM

## 2022-06-19 MED ORDER — NALOXONE 4 MG/ACTUATION NASAL SPRAY
PRN refills | 0 days | Status: CP
Start: 2022-06-19 — End: ?
  Filled 2022-06-20: qty 10, 4d supply, fill #0
  Filled 2022-06-20: qty 2, 1d supply, fill #0

## 2022-06-20 MED ORDER — TRULICITY 0.75 MG/0.5 ML SUBCUTANEOUS PEN INJECTOR
SUBCUTANEOUS | 5 refills | 28 days | Status: CP
Start: 2022-06-20 — End: ?
  Filled 2022-06-20: qty 2, 28d supply, fill #0

## 2022-06-20 MED ORDER — NITROGLYCERIN 0.4 MG SUBLINGUAL TABLET
ORAL_TABLET | SUBLINGUAL | 0 refills | 1 days | Status: CP | PRN
Start: 2022-06-20 — End: ?
  Filled 2022-06-20: qty 25, 8d supply, fill #0

## 2022-06-20 MED FILL — ASPIRIN 81 MG CHEWABLE TABLET: ORAL | 30 days supply | Qty: 30 | Fill #2

## 2022-06-20 MED FILL — ISOSORBIDE MONONITRATE ER 30 MG TABLET,EXTENDED RELEASE 24 HR: ORAL | 30 days supply | Qty: 30 | Fill #1

## 2022-06-20 MED FILL — CARVEDILOL 25 MG TABLET: ORAL | 30 days supply | Qty: 60 | Fill #1

## 2022-06-20 MED FILL — CLOPIDOGREL 75 MG TABLET: ORAL | 30 days supply | Qty: 30 | Fill #6

## 2022-06-20 MED FILL — CITALOPRAM 10 MG TABLET: ORAL | 30 days supply | Qty: 30 | Fill #0

## 2022-06-20 MED FILL — METFORMIN ER 500 MG TABLET,EXTENDED RELEASE 24 HR: ORAL | 30 days supply | Qty: 30 | Fill #1

## 2022-06-21 MED ORDER — AMITRIPTYLINE 10 MG TABLET
ORAL_TABLET | Freq: Every evening | ORAL | 3 refills | 90 days | Status: CP
Start: 2022-06-21 — End: 2023-06-21
  Filled 2022-06-26: qty 30, 30d supply, fill #0

## 2022-06-26 ENCOUNTER — Ambulatory Visit
Admit: 2022-06-26 | Discharge: 2022-06-27 | Payer: PRIVATE HEALTH INSURANCE | Attending: Internal Medicine | Primary: Internal Medicine

## 2022-06-26 ENCOUNTER — Ambulatory Visit: Admit: 2022-06-26 | Discharge: 2022-06-27 | Payer: PRIVATE HEALTH INSURANCE

## 2022-06-26 DIAGNOSIS — E1169 Type 2 diabetes mellitus with other specified complication: Principal | ICD-10-CM

## 2022-06-26 DIAGNOSIS — F1120 Opioid dependence, uncomplicated: Principal | ICD-10-CM

## 2022-06-26 DIAGNOSIS — F1410 Cocaine abuse, uncomplicated: Principal | ICD-10-CM

## 2022-06-26 MED ORDER — BUPRENORPHINE 8 MG-NALOXONE 2 MG SUBLINGUAL TABLET
ORAL_TABLET | 0 refills | 30 days | Status: CP
Start: 2022-06-26 — End: 2022-07-26
  Filled 2022-06-26: qty 70, 30d supply, fill #0

## 2022-06-26 MED ORDER — GABAPENTIN 100 MG CAPSULE
ORAL_CAPSULE | Freq: Every day | ORAL | 0 refills | 30 days | Status: CP | PRN
Start: 2022-06-26 — End: 2022-07-26
  Filled 2022-06-26: qty 30, 30d supply, fill #0

## 2022-06-26 MED FILL — ROSUVASTATIN 20 MG TABLET: ORAL | 30 days supply | Qty: 30 | Fill #1

## 2022-07-03 ENCOUNTER — Ambulatory Visit: Admit: 2022-07-03 | Payer: PRIVATE HEALTH INSURANCE

## 2022-07-09 MED ORDER — GABAPENTIN 100 MG CAPSULE
ORAL_CAPSULE | Freq: Every day | ORAL | 3 refills | 30 days | Status: CP | PRN
Start: 2022-07-09 — End: 2023-06-27

## 2022-07-10 MED ORDER — NITROGLYCERIN 0.4 MG SUBLINGUAL TABLET
ORAL_TABLET | SUBLINGUAL | 0 refills | 1 days | Status: CP | PRN
Start: 2022-07-10 — End: ?
  Filled 2022-07-23: qty 25, 1d supply, fill #0

## 2022-07-23 MED FILL — METFORMIN ER 500 MG TABLET,EXTENDED RELEASE 24 HR: ORAL | 30 days supply | Qty: 30 | Fill #2

## 2022-07-23 MED FILL — AMITRIPTYLINE 10 MG TABLET: ORAL | 30 days supply | Qty: 30 | Fill #1

## 2022-07-23 MED FILL — ASPIRIN 81 MG CHEWABLE TABLET: ORAL | 30 days supply | Qty: 30 | Fill #3

## 2022-07-23 MED FILL — CARVEDILOL 25 MG TABLET: ORAL | 30 days supply | Qty: 60 | Fill #2

## 2022-07-23 MED FILL — CLOPIDOGREL 75 MG TABLET: ORAL | 30 days supply | Qty: 30 | Fill #7

## 2022-07-23 MED FILL — ISOSORBIDE MONONITRATE ER 30 MG TABLET,EXTENDED RELEASE 24 HR: ORAL | 30 days supply | Qty: 30 | Fill #2

## 2022-07-23 MED FILL — NALOXONE 4 MG/ACTUATION NASAL SPRAY: 1 days supply | Qty: 2 | Fill #1

## 2022-07-24 ENCOUNTER — Telehealth
Admit: 2022-07-24 | Discharge: 2022-07-25 | Payer: PRIVATE HEALTH INSURANCE | Attending: Internal Medicine | Primary: Internal Medicine

## 2022-07-24 DIAGNOSIS — F1410 Cocaine abuse, uncomplicated: Principal | ICD-10-CM

## 2022-07-24 DIAGNOSIS — R5383 Other fatigue: Principal | ICD-10-CM

## 2022-07-24 DIAGNOSIS — F1121 Opioid dependence, in remission: Principal | ICD-10-CM

## 2022-07-24 DIAGNOSIS — E1169 Type 2 diabetes mellitus with other specified complication: Principal | ICD-10-CM

## 2022-07-24 MED ORDER — GABAPENTIN 100 MG CAPSULE
ORAL_CAPSULE | Freq: Every day | ORAL | 3 refills | 30 days | Status: CP | PRN
Start: 2022-07-24 — End: 2023-07-12
  Filled 2022-07-25: qty 30, 30d supply, fill #0

## 2022-07-24 MED ORDER — TRULICITY 0.75 MG/0.5 ML SUBCUTANEOUS PEN INJECTOR
SUBCUTANEOUS | 5 refills | 28 days | Status: CP
Start: 2022-07-24 — End: ?
  Filled 2022-07-25: qty 2, 28d supply, fill #0

## 2022-07-24 MED ORDER — ROSUVASTATIN 20 MG TABLET
ORAL_TABLET | Freq: Every evening | ORAL | 3 refills | 90 days | Status: CP
Start: 2022-07-24 — End: 2023-07-24
  Filled 2022-07-25: qty 30, 30d supply, fill #0

## 2022-07-24 MED ORDER — BUPRENORPHINE 8 MG-NALOXONE 2 MG SUBLINGUAL TABLET
ORAL_TABLET | 0 refills | 30 days | Status: CP
Start: 2022-07-24 — End: 2022-08-23
  Filled 2022-07-25: qty 60, 30d supply, fill #0

## 2022-08-21 MED ORDER — BUPRENORPHINE 8 MG-NALOXONE 2 MG SUBLINGUAL TABLET
ORAL_TABLET | Freq: Two times a day (BID) | SUBLINGUAL | 0 refills | 21 days | Status: CP
Start: 2022-08-21 — End: 2022-09-11
  Filled 2022-08-22: qty 42, 21d supply, fill #0

## 2022-08-22 MED ORDER — OMEPRAZOLE 20 MG CAPSULE,DELAYED RELEASE
ORAL_CAPSULE | Freq: Every day | ORAL | 3 refills | 90 days | Status: CP
Start: 2022-08-22 — End: 2023-08-22
  Filled 2022-08-22: qty 30, 30d supply, fill #0

## 2022-08-22 MED FILL — CLOPIDOGREL 75 MG TABLET: ORAL | 30 days supply | Qty: 30 | Fill #8

## 2022-08-22 MED FILL — CARVEDILOL 25 MG TABLET: ORAL | 30 days supply | Qty: 60 | Fill #3

## 2022-08-22 MED FILL — ISOSORBIDE MONONITRATE ER 30 MG TABLET,EXTENDED RELEASE 24 HR: ORAL | 30 days supply | Qty: 30 | Fill #3

## 2022-08-22 MED FILL — GABAPENTIN 100 MG CAPSULE: ORAL | 30 days supply | Qty: 30 | Fill #1

## 2022-08-22 MED FILL — TRULICITY 0.75 MG/0.5 ML SUBCUTANEOUS PEN INJECTOR: SUBCUTANEOUS | 28 days supply | Qty: 2 | Fill #1

## 2022-08-22 MED FILL — AMITRIPTYLINE 10 MG TABLET: ORAL | 30 days supply | Qty: 30 | Fill #2

## 2022-08-22 MED FILL — ASPIRIN 81 MG CHEWABLE TABLET: ORAL | 30 days supply | Qty: 30 | Fill #4

## 2022-08-22 MED FILL — ROSUVASTATIN 20 MG TABLET: ORAL | 30 days supply | Qty: 30 | Fill #1

## 2022-08-22 MED FILL — METFORMIN ER 500 MG TABLET,EXTENDED RELEASE 24 HR: ORAL | 30 days supply | Qty: 30 | Fill #3

## 2022-09-11 MED ORDER — BUPRENORPHINE 8 MG-NALOXONE 2 MG SUBLINGUAL TABLET
ORAL_TABLET | Freq: Two times a day (BID) | SUBLINGUAL | 0 refills | 8 days | Status: CP
Start: 2022-09-11 — End: 2022-09-19
  Filled 2022-09-12: qty 16, 8d supply, fill #0

## 2022-09-12 ENCOUNTER — Ambulatory Visit: Admit: 2022-09-12 | Payer: PRIVATE HEALTH INSURANCE

## 2022-09-12 MED FILL — GABAPENTIN 100 MG CAPSULE: ORAL | 30 days supply | Qty: 30 | Fill #2

## 2022-09-12 MED FILL — AMITRIPTYLINE 10 MG TABLET: ORAL | 30 days supply | Qty: 30 | Fill #3

## 2022-09-26 MED ORDER — ISOSORBIDE MONONITRATE ER 30 MG TABLET,EXTENDED RELEASE 24 HR
ORAL_TABLET | Freq: Every day | ORAL | 3 refills | 90 days | Status: CP
Start: 2022-09-26 — End: 2023-09-26
  Filled 2022-09-26: qty 90, 90d supply, fill #0

## 2022-09-26 MED ORDER — BUPRENORPHINE 8 MG-NALOXONE 2 MG SUBLINGUAL TABLET
ORAL_TABLET | Freq: Two times a day (BID) | SUBLINGUAL | 0 refills | 14 days | Status: CP
Start: 2022-09-26 — End: 2022-10-10
  Filled 2022-09-26: qty 28, 14d supply, fill #0

## 2022-09-26 MED ORDER — CARVEDILOL 25 MG TABLET
ORAL_TABLET | Freq: Two times a day (BID) | ORAL | 3 refills | 90 days | Status: CP
Start: 2022-09-26 — End: ?
  Filled 2022-09-26: qty 180, 90d supply, fill #0

## 2022-09-26 MED FILL — ROSUVASTATIN 20 MG TABLET: ORAL | 30 days supply | Qty: 30 | Fill #2

## 2022-09-26 MED FILL — OMEPRAZOLE 20 MG CAPSULE,DELAYED RELEASE: ORAL | 30 days supply | Qty: 30 | Fill #1

## 2022-09-26 MED FILL — CLOPIDOGREL 75 MG TABLET: ORAL | 30 days supply | Qty: 30 | Fill #9

## 2022-09-26 MED FILL — ASPIRIN 81 MG CHEWABLE TABLET: ORAL | 30 days supply | Qty: 30 | Fill #5

## 2022-09-26 MED FILL — METFORMIN ER 500 MG TABLET,EXTENDED RELEASE 24 HR: ORAL | 30 days supply | Qty: 30 | Fill #4

## 2022-09-27 MED ORDER — NITROGLYCERIN 0.4 MG SUBLINGUAL TABLET
ORAL_TABLET | SUBLINGUAL | 0 refills | 1 days | Status: CP | PRN
Start: 2022-09-27 — End: ?
  Filled 2022-10-03: qty 25, 8d supply, fill #0

## 2022-10-02 ENCOUNTER — Ambulatory Visit: Admit: 2022-10-02 | Payer: PRIVATE HEALTH INSURANCE

## 2022-10-03 ENCOUNTER — Ambulatory Visit
Admit: 2022-10-03 | Discharge: 2022-10-03 | Disposition: A | Discharge status: Home / Self Care | Payer: PRIVATE HEALTH INSURANCE | Attending: Student in an Organized Health Care Education/Training Program

## 2022-10-03 ENCOUNTER — Emergency Department
Admit: 2022-10-03 | Discharge: 2022-10-03 | Disposition: A | Discharge status: Home / Self Care | Payer: PRIVATE HEALTH INSURANCE | Attending: Student in an Organized Health Care Education/Training Program

## 2022-10-03 MED ORDER — METHOCARBAMOL 500 MG TABLET
ORAL_TABLET | Freq: Two times a day (BID) | ORAL | 0 refills | 5 days | Status: CP
Start: 2022-10-03 — End: 2022-10-08
  Filled 2022-10-03: qty 10, 5d supply, fill #0

## 2022-10-09 ENCOUNTER — Telehealth
Admit: 2022-10-09 | Discharge: 2022-10-10 | Payer: PRIVATE HEALTH INSURANCE | Attending: Internal Medicine | Primary: Internal Medicine

## 2022-10-09 DIAGNOSIS — F1120 Opioid dependence, uncomplicated: Principal | ICD-10-CM

## 2022-10-09 DIAGNOSIS — F32A Depression, unspecified depression type: Principal | ICD-10-CM

## 2022-10-09 MED ORDER — BUPRENORPHINE 8 MG-NALOXONE 2 MG SUBLINGUAL TABLET
ORAL_TABLET | Freq: Two times a day (BID) | SUBLINGUAL | 0 refills | 14 days | Status: CP
Start: 2022-10-09 — End: 2022-10-23
  Filled 2022-10-11: qty 28, 14d supply, fill #0

## 2022-10-23 ENCOUNTER — Telehealth
Admit: 2022-10-23 | Discharge: 2022-10-24 | Payer: PRIVATE HEALTH INSURANCE | Attending: Internal Medicine | Primary: Internal Medicine

## 2022-10-23 ENCOUNTER — Ambulatory Visit: Admit: 2022-10-23 | Discharge: 2022-10-24 | Payer: PRIVATE HEALTH INSURANCE

## 2022-10-23 DIAGNOSIS — F1120 Opioid dependence, uncomplicated: Principal | ICD-10-CM

## 2022-10-23 DIAGNOSIS — F1410 Cocaine abuse, uncomplicated: Principal | ICD-10-CM

## 2022-10-23 DIAGNOSIS — E1169 Type 2 diabetes mellitus with other specified complication: Principal | ICD-10-CM

## 2022-10-23 MED ORDER — BUPRENORPHINE 8 MG-NALOXONE 2 MG SUBLINGUAL TABLET
ORAL_TABLET | Freq: Two times a day (BID) | SUBLINGUAL | 0 refills | 8 days | Status: CP
Start: 2022-10-23 — End: 2022-10-31
  Filled 2022-10-24: qty 16, 8d supply, fill #0

## 2022-10-31 MED ORDER — NITROGLYCERIN 0.4 MG SUBLINGUAL TABLET
ORAL_TABLET | SUBLINGUAL | 0 refills | 1 days | Status: CP | PRN
Start: 2022-10-31 — End: ?
  Filled 2022-11-02: qty 25, 8d supply, fill #0

## 2022-11-02 MED FILL — AMITRIPTYLINE 10 MG TABLET: ORAL | 30 days supply | Qty: 30 | Fill #4

## 2022-11-02 MED FILL — METFORMIN ER 500 MG TABLET,EXTENDED RELEASE 24 HR: ORAL | 30 days supply | Qty: 30 | Fill #5

## 2022-11-13 ENCOUNTER — Ambulatory Visit: Admit: 2022-11-13 | Payer: PRIVATE HEALTH INSURANCE | Attending: Internal Medicine | Primary: Internal Medicine

## 2022-11-18 MED ORDER — BUPRENORPHINE 8 MG-NALOXONE 2 MG SUBLINGUAL TABLET
ORAL_TABLET | Freq: Two times a day (BID) | SUBLINGUAL | 0 refills | 8 days | Status: CP
Start: 2022-11-18 — End: 2022-11-26
  Filled 2022-11-19: qty 16, 8d supply, fill #0

## 2022-12-03 MED ORDER — NITROGLYCERIN 0.4 MG SUBLINGUAL TABLET
ORAL_TABLET | SUBLINGUAL | 0 refills | 1 days | Status: CP | PRN
Start: 2022-12-03 — End: ?
  Filled 2022-12-07: qty 25, 8d supply, fill #0

## 2022-12-07 MED FILL — ASPIRIN 81 MG CHEWABLE TABLET: ORAL | 30 days supply | Qty: 30 | Fill #6

## 2022-12-07 MED FILL — ROSUVASTATIN 20 MG TABLET: ORAL | 30 days supply | Qty: 30 | Fill #3

## 2022-12-07 MED FILL — GABAPENTIN 100 MG CAPSULE: ORAL | 30 days supply | Qty: 30 | Fill #3

## 2022-12-07 MED FILL — AMITRIPTYLINE 10 MG TABLET: ORAL | 30 days supply | Qty: 30 | Fill #5

## 2022-12-07 MED FILL — METFORMIN ER 500 MG TABLET,EXTENDED RELEASE 24 HR: ORAL | 30 days supply | Qty: 30 | Fill #6

## 2022-12-09 MED ORDER — CLOPIDOGREL 75 MG TABLET
ORAL_TABLET | Freq: Every day | ORAL | 11 refills | 30 days | Status: CP
Start: 2022-12-09 — End: ?

## 2022-12-12 ENCOUNTER — Ambulatory Visit
Admit: 2022-12-12 | Discharge: 2022-12-13 | Payer: PRIVATE HEALTH INSURANCE | Attending: Student in an Organized Health Care Education/Training Program | Primary: Student in an Organized Health Care Education/Training Program

## 2022-12-12 DIAGNOSIS — F1120 Opioid dependence, uncomplicated: Principal | ICD-10-CM

## 2022-12-12 DIAGNOSIS — F1410 Cocaine abuse, uncomplicated: Principal | ICD-10-CM

## 2022-12-12 MED ORDER — CLOPIDOGREL 75 MG TABLET
ORAL_TABLET | Freq: Every day | ORAL | 11 refills | 30 days | Status: CP
Start: 2022-12-12 — End: ?
  Filled 2022-12-12: qty 30, 30d supply, fill #0

## 2022-12-12 MED ORDER — BUPRENORPHINE 8 MG-NALOXONE 2 MG SUBLINGUAL TABLET
ORAL_TABLET | Freq: Two times a day (BID) | SUBLINGUAL | 0 refills | 30 days | Status: CP
Start: 2022-12-12 — End: 2023-01-11
  Filled 2022-12-12: qty 60, 30d supply, fill #0

## 2022-12-26 ENCOUNTER — Ambulatory Visit
Admit: 2022-12-26 | Discharge: 2022-12-27 | Payer: PRIVATE HEALTH INSURANCE | Attending: Internal Medicine | Primary: Internal Medicine

## 2022-12-26 MED ORDER — TRULICITY 0.75 MG/0.5 ML SUBCUTANEOUS PEN INJECTOR
SUBCUTANEOUS | 5 refills | 28 days | Status: CP
Start: 2022-12-26 — End: ?
  Filled 2023-01-08: qty 2, 28d supply, fill #0

## 2022-12-26 MED ORDER — LISINOPRIL 20 MG TABLET
ORAL_TABLET | Freq: Every day | ORAL | 3 refills | 90 days | Status: CP
Start: 2022-12-26 — End: 2023-12-26
  Filled 2023-01-08: qty 90, 90d supply, fill #0

## 2023-01-08 MED FILL — AMITRIPTYLINE 10 MG TABLET: ORAL | 30 days supply | Qty: 30 | Fill #6

## 2023-01-08 MED FILL — METFORMIN ER 500 MG TABLET,EXTENDED RELEASE 24 HR: ORAL | 30 days supply | Qty: 30 | Fill #7

## 2023-01-08 MED FILL — BUPRENORPHINE 8 MG-NALOXONE 2 MG SUBLINGUAL TABLET: SUBLINGUAL | 13 days supply | Qty: 26 | Fill #0

## 2023-01-08 MED FILL — CARVEDILOL 25 MG TABLET: ORAL | 90 days supply | Qty: 180 | Fill #1

## 2023-01-08 MED FILL — OMEPRAZOLE 20 MG CAPSULE,DELAYED RELEASE: ORAL | 30 days supply | Qty: 30 | Fill #2

## 2023-01-08 MED FILL — ISOSORBIDE MONONITRATE ER 30 MG TABLET,EXTENDED RELEASE 24 HR: ORAL | 90 days supply | Qty: 90 | Fill #1

## 2023-01-08 MED FILL — ROSUVASTATIN 20 MG TABLET: ORAL | 30 days supply | Qty: 30 | Fill #4

## 2023-01-10 MED ORDER — GABAPENTIN 100 MG CAPSULE
ORAL_CAPSULE | Freq: Every day | ORAL | 3 refills | 30 days | Status: CP | PRN
Start: 2023-01-10 — End: 2023-12-29
  Filled 2023-01-11: qty 30, 30d supply, fill #0

## 2023-01-10 MED ORDER — NITROGLYCERIN 0.4 MG SUBLINGUAL TABLET
ORAL_TABLET | SUBLINGUAL | 0 refills | 1 days | Status: CP | PRN
Start: 2023-01-10 — End: ?

## 2023-01-10 MED ORDER — BUPRENORPHINE 8 MG-NALOXONE 2 MG SUBLINGUAL TABLET
ORAL_TABLET | Freq: Two times a day (BID) | SUBLINGUAL | 0 refills | 13 days | Status: CP
Start: 2023-01-10 — End: 2023-01-23

## 2023-01-11 MED FILL — CLOPIDOGREL 75 MG TABLET: ORAL | 30 days supply | Qty: 30 | Fill #1

## 2023-01-20 DIAGNOSIS — F1120 Opioid dependence, uncomplicated: Principal | ICD-10-CM

## 2023-01-20 MED ORDER — BUPRENORPHINE 8 MG-NALOXONE 2 MG SUBLINGUAL TABLET
ORAL_TABLET | Freq: Two times a day (BID) | SUBLINGUAL | 0 refills | 16 days | Status: CP
Start: 2023-01-20 — End: 2023-02-05
  Filled 2023-01-21: qty 32, 16d supply, fill #0

## 2023-02-06 DIAGNOSIS — F1120 Opioid dependence, uncomplicated: Principal | ICD-10-CM

## 2023-02-07 DIAGNOSIS — F1120 Opioid dependence, uncomplicated: Principal | ICD-10-CM

## 2023-02-07 MED ORDER — CLONIDINE HCL 0.1 MG TABLET
ORAL_TABLET | Freq: Two times a day (BID) | ORAL | 0 refills | 30 days | Status: CP
Start: 2023-02-07 — End: 2024-02-07
  Filled 2023-02-07: qty 60, 30d supply, fill #0

## 2023-02-07 MED ORDER — BUPRENORPHINE 8 MG-NALOXONE 2 MG SUBLINGUAL TABLET
ORAL_TABLET | Freq: Two times a day (BID) | SUBLINGUAL | 0 refills | 40 days | Status: CP
Start: 2023-02-07 — End: 2023-03-19
  Filled 2023-02-07: qty 60, 30d supply, fill #0

## 2023-02-13 MED FILL — ROSUVASTATIN 20 MG TABLET: ORAL | 30 days supply | Qty: 30 | Fill #5

## 2023-02-13 MED FILL — AMITRIPTYLINE 10 MG TABLET: ORAL | 30 days supply | Qty: 30 | Fill #7

## 2023-02-13 MED FILL — CLOPIDOGREL 75 MG TABLET: ORAL | 30 days supply | Qty: 30 | Fill #2

## 2023-02-13 MED FILL — TRULICITY 0.75 MG/0.5 ML SUBCUTANEOUS PEN INJECTOR: SUBCUTANEOUS | 28 days supply | Qty: 2 | Fill #1

## 2023-02-13 MED FILL — METFORMIN ER 500 MG TABLET,EXTENDED RELEASE 24 HR: ORAL | 30 days supply | Qty: 30 | Fill #8

## 2023-02-13 MED FILL — GABAPENTIN 100 MG CAPSULE: ORAL | 30 days supply | Qty: 30 | Fill #1

## 2023-02-27 ENCOUNTER — Ambulatory Visit: Admit: 2023-02-27 | Discharge: 2023-02-28 | Payer: PRIVATE HEALTH INSURANCE

## 2023-02-27 DIAGNOSIS — Z59819 Housing insecurity: Principal | ICD-10-CM

## 2023-03-07 MED FILL — BUPRENORPHINE 8 MG-NALOXONE 2 MG SUBLINGUAL TABLET: SUBLINGUAL | 10 days supply | Qty: 20 | Fill #1

## 2023-03-13 ENCOUNTER — Ambulatory Visit: Admit: 2023-03-13 | Discharge: 2023-03-14 | Payer: PRIVATE HEALTH INSURANCE

## 2023-03-19 ENCOUNTER — Ambulatory Visit: Admit: 2023-03-19 | Payer: PRIVATE HEALTH INSURANCE | Attending: Internal Medicine | Primary: Internal Medicine

## 2023-03-19 ENCOUNTER — Ambulatory Visit: Admit: 2023-03-19 | Payer: PRIVATE HEALTH INSURANCE

## 2023-03-21 DIAGNOSIS — F1120 Opioid dependence, uncomplicated: Principal | ICD-10-CM

## 2023-03-21 MED ORDER — CLONIDINE HCL 0.1 MG TABLET
ORAL | 0 refills | 30 days | Status: CP
Start: 2023-03-21 — End: 2024-03-20
  Filled 2023-03-25: qty 60, 30d supply, fill #0

## 2023-03-24 MED ORDER — BUPRENORPHINE 8 MG-NALOXONE 2 MG SUBLINGUAL TABLET
ORAL_TABLET | Freq: Two times a day (BID) | SUBLINGUAL | 0 refills | 3 days | Status: CP
Start: 2023-03-24 — End: 2023-03-27

## 2023-03-25 MED FILL — TRULICITY 0.75 MG/0.5 ML SUBCUTANEOUS PEN INJECTOR: SUBCUTANEOUS | 28 days supply | Qty: 2 | Fill #2

## 2023-03-25 MED FILL — GABAPENTIN 100 MG CAPSULE: ORAL | 30 days supply | Qty: 30 | Fill #2

## 2023-03-25 MED FILL — AMITRIPTYLINE 10 MG TABLET: ORAL | 30 days supply | Qty: 30 | Fill #8

## 2023-03-26 ENCOUNTER — Ambulatory Visit: Admit: 2023-03-26 | Discharge: 2023-03-26 | Payer: PRIVATE HEALTH INSURANCE

## 2023-03-26 ENCOUNTER — Ambulatory Visit
Admit: 2023-03-26 | Discharge: 2023-03-26 | Payer: PRIVATE HEALTH INSURANCE | Attending: Internal Medicine | Primary: Internal Medicine

## 2023-03-26 DIAGNOSIS — F1120 Opioid dependence, uncomplicated: Principal | ICD-10-CM

## 2023-03-26 DIAGNOSIS — F1121 Opioid dependence, in remission: Principal | ICD-10-CM

## 2023-03-26 MED ORDER — LISINOPRIL 20 MG TABLET
ORAL_TABLET | Freq: Every day | ORAL | 3 refills | 90.00000 days | Status: CP
Start: 2023-03-26 — End: 2024-03-25

## 2023-03-26 MED ORDER — ISOSORBIDE MONONITRATE ER 30 MG TABLET,EXTENDED RELEASE 24 HR
ORAL_TABLET | Freq: Every day | ORAL | 3 refills | 90 days | Status: CP
Start: 2023-03-26 — End: 2024-03-25
  Filled 2023-04-22: qty 90, 90d supply, fill #0

## 2023-03-26 MED ORDER — OMEPRAZOLE 20 MG CAPSULE,DELAYED RELEASE
ORAL_CAPSULE | Freq: Every day | ORAL | 3 refills | 90 days | Status: CP
Start: 2023-03-26 — End: 2024-03-25
  Filled 2023-04-22: qty 90, 90d supply, fill #0

## 2023-03-26 MED ORDER — BUPRENORPHINE 8 MG-NALOXONE 2 MG SUBLINGUAL TABLET
ORAL_TABLET | Freq: Two times a day (BID) | SUBLINGUAL | 0 refills | 28 days | Status: CP
Start: 2023-03-26 — End: 2023-04-23
  Filled 2023-03-25: qty 6, 3d supply, fill #0
  Filled 2023-03-26: qty 56, 28d supply, fill #0

## 2023-03-26 MED ORDER — METFORMIN ER 500 MG TABLET,EXTENDED RELEASE 24 HR
ORAL_TABLET | Freq: Every day | ORAL | 3 refills | 90.00000 days | Status: CP
Start: 2023-03-26 — End: 2023-03-26
  Filled 2023-03-25: qty 30, 30d supply, fill #9
  Filled 2023-04-22: qty 180, 90d supply, fill #0

## 2023-03-26 MED ORDER — TRULICITY 1.5 MG/0.5 ML SUBCUTANEOUS PEN INJECTOR
SUBCUTANEOUS | 3 refills | 84 days | Status: CP
Start: 2023-03-26 — End: ?
  Filled 2023-05-07: qty 2, 28d supply, fill #0

## 2023-03-26 MED ORDER — DICLOFENAC 1 % TOPICAL GEL
Freq: Four times a day (QID) | TOPICAL | 0 refills | 13 days | Status: CP
Start: 2023-03-26 — End: 2024-03-25
  Filled 2023-04-22: qty 100, 13d supply, fill #0

## 2023-03-26 MED ORDER — CARVEDILOL 25 MG TABLET
ORAL_TABLET | Freq: Two times a day (BID) | ORAL | 3 refills | 90 days | Status: CP
Start: 2023-03-26 — End: ?
  Filled 2023-04-22: qty 180, 90d supply, fill #0

## 2023-03-26 MED ORDER — ROSUVASTATIN 20 MG TABLET
ORAL_TABLET | Freq: Every evening | ORAL | 3 refills | 90 days | Status: CP
Start: 2023-03-26 — End: 2024-03-25
  Filled 2023-03-25: qty 30, 30d supply, fill #6
  Filled 2023-04-22: qty 90, 90d supply, fill #0

## 2023-03-26 MED ORDER — TRAZODONE 50 MG TABLET
ORAL_TABLET | 3 refills | 0 days | Status: CP
Start: 2023-03-26 — End: ?
  Filled 2023-04-22: qty 90, 90d supply, fill #0

## 2023-03-26 MED ORDER — CLOPIDOGREL 75 MG TABLET
ORAL_TABLET | Freq: Every day | ORAL | 11 refills | 30 days | Status: CP
Start: 2023-03-26 — End: ?
  Filled 2023-03-25: qty 30, 30d supply, fill #3
  Filled 2023-04-22: qty 90, 90d supply, fill #0

## 2023-03-26 MED ORDER — NITROGLYCERIN 0.4 MG SUBLINGUAL TABLET
ORAL_TABLET | SUBLINGUAL | 0 refills | 1 days | Status: CP | PRN
Start: 2023-03-26 — End: ?
  Filled 2023-04-22: qty 25, 2d supply, fill #0

## 2023-03-27 MED ORDER — CLONIDINE HCL 0.1 MG TABLET
ORAL | 3 refills | 90 days | Status: CP
Start: 2023-03-27 — End: 2024-03-26
  Filled 2023-04-22: qty 180, 90d supply, fill #0

## 2023-04-01 NOTE — Unmapped (Signed)
[facility] Specialty Medication Onboarding    Specialty Medication: TRULICITY 1.5 mg/0.5 mL Pnij (dulaglutide)  Prior Authorization: Not Required   Financial Assistance: Yes - copay card approved as secondary (e voucher)  Final Copay/Day Supply: $25 / 28    Insurance Restrictions: None     Notes to Pharmacist: dose change  Credit Card on File: no    The triage team has completed the benefits investigation and has determined that the patient is able to fill this medication at [facility]. Please contact the patient to complete the onboarding or follow up with the prescribing physician as needed.

## 2023-04-09 NOTE — Unmapped (Signed)
Ashley Williamson, Mikaiah Stoffer Vincent Peyer, CNA, attempted 2nd patient contact by [employer] for Ashley Williamson (CHW) Program - left voicemail to return call.Next attempt in 1-2 business days, if no further patient contact    Paulla Fore, CNA  April 09, 2023 9:18 AM

## 2023-04-11 ENCOUNTER — Ambulatory Visit: Admit: 2023-04-11 | Discharge: 2023-04-12

## 2023-04-11 NOTE — Unmapped (Signed)
COMMUNITY HEALTH WORKER  Brief Contact Note    04/11/2023  Inform/Regulatory Low priority, no response needed unless change in care.     I am reaching out to [employer] as part of the community health worker team regarding her [employer].    Patient advised to  Sign [employer] consent form  .      Next appt with PCP: N/a     See chart for med list if needed    Sharing communication as part of regulatory requirements.     Assessment:    CHW contacted Patient for follow up regarding Housing instability  No additional needs identified  Spoke with ms. Zaldivar . Patient states she forgotten about the consent form from [employer] . Community Health Worker advised patient to sign Consent form for housing resources . [employer] Worker resent [employer] consent form for patient to sign .  Food Insecurity: Food Insecurity Present (10/22/2022)    Hunger Vital Sign     Worried About Running Out of Food in the Last Year: Sometimes true     Ran Out of Food in the Last Year: Sometimes true     Transportation Needs: Patient Declined (10/22/2022)    PRAPARE - Therapist, art (Medical): Patient declined     Lack of Transportation (Non-Medical): Patient declined     Physicist, medical Strain: High Risk (10/22/2022)    Overall Financial Resource Strain (CARDIA)     Difficulty of Paying Living Expenses: Hard     Housing/Utilities: Low Risk  (10/22/2022)    Housing/Utilities     Within the past 12 months, have you ever stayed: outside, in a car, in a tent, in an overnight shelter, or temporarily in someone else's home (i.e. couch-surfing)?: No     Are you worried about losing your housing?: No     Within the past 12 months, have you been unable to get utilities (heat, electricity) when it was really needed?: No     Health Literacy: Medium Risk (02/07/2021)    Health Literacy     : Sometimes      Social Connections: Socially Isolated (02/07/2021)    Social Connection and Isolation Panel [NHANES] Frequency of Communication with Friends and Family: More than three times a week     Frequency of Social Gatherings with Friends and Family: More than three times a week     Attends Religious Services: Never     Database administrator or Organizations: No     Attends Banker Meetings: Never     Marital Status: Divorced         The intervention was Supportive listening and Resent [employer] consent form       Patient expressed understanding.    Next Follow-up: Next scheduled call is 05/08/23    Note Routed: No.  Routed to:N/A    Signed: Paulla Fore, CNA

## 2023-04-16 NOTE — Unmapped (Signed)
[facility] has reached out to this patient via MyChart to onboard them to our Specialty Lite services for their Trulicity. Their medication has never been filled.They will now receive proactive outreach from the pharmacy team for refills.    Arnold Long, PharmD  [facility] Specialty and Home Delivery Pharmacist

## 2023-04-25 NOTE — Unmapped (Signed)
Medication Question/ Issue from Patient    Name of medication: dulaglutide (TRULICITY) 1.5 mg/0.5 mL PnIj     Caller described issue: patient was told PA is needed.    Caller desired outcome (eg, re-send to pharmacy): submit PA.    Best callback number if any questions (defaults to patient's preferred phone - confirm or change): [phone]  PCP: Leda Min, PA  Last encounter in department: 03/26/2023  (If more than a year, offer an appointment.)   [name]  04/25/23

## 2023-04-29 ENCOUNTER — Ambulatory Visit: Admit: 2023-04-29 | Discharge: 2023-04-30 | Payer: PRIVATE HEALTH INSURANCE

## 2023-04-29 ENCOUNTER — Telehealth
Admit: 2023-04-29 | Discharge: 2023-04-30 | Payer: PRIVATE HEALTH INSURANCE | Attending: Internal Medicine | Primary: Internal Medicine

## 2023-04-29 DIAGNOSIS — J069 Acute upper respiratory infection, unspecified: Principal | ICD-10-CM

## 2023-04-29 DIAGNOSIS — F1120 Opioid dependence, uncomplicated: Principal | ICD-10-CM

## 2023-04-29 MED ORDER — BUPRENORPHINE 8 MG-NALOXONE 2 MG SUBLINGUAL TABLET
ORAL_TABLET | Freq: Two times a day (BID) | SUBLINGUAL | 0 refills | 30 days | Status: CP
Start: 2023-04-29 — End: 2023-05-29
  Filled 2023-04-29: qty 60, 30d supply, fill #0

## 2023-04-29 MED ORDER — NALOXONE 4 MG/ACTUATION NASAL SPRAY
PRN refills | 0 days | Status: CP
Start: 2023-04-29 — End: ?

## 2023-04-29 MED ORDER — ALBUTEROL SULFATE HFA 90 MCG/ACTUATION AEROSOL INHALER
Freq: Four times a day (QID) | RESPIRATORY_TRACT | 0 refills | 25 days | Status: CP | PRN
Start: 2023-04-29 — End: 2024-04-28
  Filled 2023-04-29: qty 8.5, 25d supply, fill #0

## 2023-04-29 NOTE — Unmapped (Signed)
[facility] Internal Medicine at [facility]     Are you located in [place]? yes    Reason for visit: Follow up    Questions / Concerns that need to be addressed:     PTHomeBP         HCDM reviewed and updated in Epic:    We are working to make sure all of our patients??? wishes are updated in Epic and part of that is documenting a Environmental health practitioner for each patient  A Health Care Decision Maker is someone you choose who can make health care decisions for you if you are not able - who would you most want to do this for you????  was updated.    HCDM (patient stated preference): Scriven,Matt - Son - [phone]    HCDM, First AlternateDerrel Nip - Sister - [phone]    BPAs completed:  PHQ2, PHQ9, GAD7, AUDIT - Alcohol Screen, and SDOH Screening    __________________________________________________________________________________________    SCREENINGS COMPLETED IN FLOWSHEETS      AUDIT       PHQ2       PHQ9          GAD7       COPD Assessment       Falls Risk

## 2023-04-29 NOTE — Unmapped (Signed)
Internal Medicine Video Visit    This visit is conducted via video conferencing.    Contact Information  Person Contacted: Patient  Contact Phone number: [phone] (home)   Is there someone else in the room? No.   Patient agreed to a video visit    Ms. Quintas is a 62 y.o. female  participating in a video visit.    ASSESSMENT / PLAN:          Plan  Patient IMCMATSTABILITY: is vulnerable on maintenance therapy Othersee me in person in 1 month, she wants to switch to LAI, will see if we can get sublocade sent over from [facility]. For now will continue BID bupe SL.      PRESCRIPTIONS GIVEN TODAY:    Medications ordered during this encounter   Medications    albuterol HFA 90 mcg/actuation inhaler     Sig: Inhale 2 puffs by mouth every six (6) hours as needed for wheezing.     Dispense:  8.5 g     Refill:  0    buprenorphine-naloxone (SUBOXONE) 8-2 mg sublingual tablet     Sig: Place 1 tablet (8 mg of buprenorphine total) under the tongue two (2) times a day.     Dispense:  60 tablet     Refill:  0    buprenorphine (SUBLOCADE) 100 mg/0.5 mL slsy     Sig: Inject 100 mg under the skin every twenty-eight (28) days.     Dispense:  0.5 mL     Refill:  0      - Benefits of treatment outweigh the risks  - Utox history reviewed, discussed.   - PDMP reviewed: Appropriate on review today  - Needs naloxone?refilled  - Next appt: 05/28/23 in person        Other medical problems will be monitored by Leda Min, PA     SUBJECTIVE:      History of Present Illness    Ms. Lussier is a 62 y.o. female who presents for OUD follow up:     Our last visit was 1 month ago.  She was overall feeling well. Struggled with an unstable and stressful housing situation, roommate with use disorder, transportation insecurity.  Urine was expected with cocaine, cannabinoids and buprenorphine.      She is feeling malaised today, woke up feeling unwell. No fever.  Does feel achy all over, like she is coming down with something.  Felt like someone had beaten her up.  No missed doses of bupe.  Mild scratchy throat.  No headache. She is coughing more, non-productive. Not hard to breathe.      She did go to the pool with her grandkids a few days ago, a bit more exertion, water was cold, ? If this brought this on.     Not sure if she has COPD, doesn't have an inhaler at home. NO prior spirometry.        She tried taking her buprenorphine all at once and struggled with the taste, she is open to the long acting injection.       - Pt is currently taking buprenorphine/naloxone SL film at a dose of 8mg  BID.  - Cravings? none  - Side effects? none  - Any medication left over today? no  - Withdrawal symptoms?None  - Any non-prescribed substance use since last visit?  cocaine  - Does pt have naloxone at home and know how to use it?  yes  - Attending: No therapy/meetings currently  Past Medical, Family, and Social History    I have reviewed the patients problem list, current medications, allergies, and social history and updated them as needed.      REVIEW OF SYSTEMS  No nausea, constipation, weight changes    Medications, PMH and allergies reviewed and updated.         1/23: staying with her mom for now.  Her brother is handicapped and she cares for him as well at their house as well as her great niece.    3/23:  mom passed in 1/23, she is living with her son now. Granddaughter [name] comes to visit.                                           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   9/23:  lost job at [employer], looking for new work.     OBJECTIVE:     Ht 160 cm (5' 2.99) Comment: stated by patient - Wt 72.6 kg (160 lb) Comment: stated by patient - BMI 28.35 kg/m??      Wt Readings from Last 3 Encounters:   04/29/23 72.6 kg (160 lb)   03/26/23 73.3 kg (161 lb 9.6 oz)   12/12/22 76.2 kg (168 lb)       Physical Examination    General appearance - alert, well appearing, and in no distress, occasional productive cough but no tachypnea  Mental status - alert, oriented to person, place, and time  Eyes - anicteric  Mouth - mucous membranes moist  Neurological - alert, oriented, normal speech              The patient reports they are physically located in [place] and is currently: at home. I conducted a audio/video visit. I spent  57m 35s on the video call with the patient. I spent an additional 15 minutes on pre- and post-visit activities on the date of service .

## 2023-05-01 NOTE — Unmapped (Signed)
[facility] Specialty Medication Onboarding    Specialty Medication: Sublocade  Prior Authorization: Not Required   Financial Assistance: No - copay  <$25  Final Copay/Day Supply: $0 / 28    Insurance Restrictions: None     Notes to Pharmacist: None  Credit Card on File: not applicable    The triage team has completed the benefits investigation and has determined that the patient is able to fill this medication at [facility]. Please contact the patient to complete the onboarding or follow up with the prescribing physician as needed.

## 2023-05-08 NOTE — Unmapped (Signed)
[employer], Francisca Harbuck Vincent Peyer, CNA, attempted 3rd patient contact by [employer] for [employer] (CHW) Program - left voicemail to return call.No additional contact attempts will be made by CHW. CHW will assist patient if they reach out.    Paulla Fore, CNA  May 08, 2023 2:06 PM

## 2023-05-21 MED ORDER — BUPRENORPHINE ER 100 MG/0.5 ML SOLUTION,EXTEN.REL.SUBCUTANEOUS SYRINGE
SUBCUTANEOUS | 0 refills | 28 days | Status: CP
Start: 2023-05-21 — End: ?

## 2023-05-22 NOTE — Unmapped (Signed)
This patient is receiving Sublocade as a clinic administered medication. Pharmacist reviewed the prescription and the patient's chart and determined that therapy is appropriate.  Patient is: aware of copay $0 . All future communication to occur between the [facility] Delivery Pharm[facility] and the clinic. This patient is being disenrolled from our specialty management program and will be added to a patient list for appropriate follow up.    [facility] Specialty and Home Delivery Pharmacy Clinic Administered Medication Refill Coordination Note      NAME:Ashley Williamson DOB: 08-Jul-1961      Medication: Sublocade (REMs Product)    Day Supply: 28 days      SHIPPING      Next delivery from [facility] [phone]) to  [facility]  for Ashley Williamson is scheduled for 05/26/23.    Clinic contact: Mellody Dance and Dr. Glo Herring    Patient's next nurse visit for administration: 05/28/23.    We will follow up with clinic monthly for standard refill processing and delivery.      Arnold Long, PharmD  [facility] Home Delivery Pharm[facility] Specialty Pharmacist

## 2023-05-27 DIAGNOSIS — F1120 Opioid dependence, uncomplicated: Principal | ICD-10-CM

## 2023-05-27 MED ORDER — BUPRENORPHINE 8 MG-NALOXONE 2 MG SUBLINGUAL TABLET
ORAL_TABLET | Freq: Two times a day (BID) | SUBLINGUAL | 0 refills | 15 days | Status: CP
Start: 2023-05-27 — End: 2023-06-11
  Filled 2023-05-28: qty 30, 15d supply, fill #0

## 2023-05-27 NOTE — Unmapped (Signed)
PAIN CLINIC  Last visit date: 04/29/2023  MOUD contract last signed: 01/08/2021 (EXPIRED)  Narcan Rx: 04/29/2023  Last Utox/Opioid Confirmation: 03/26/2023    Toxicology Screen, Urine               Component  Ref Range & Units 03/26/23 1606 12/12/22 1518 06/26/22 1641 05/15/22 1630 04/17/22 1336 11/21/21 1525 09/26/21 1534    Amphetamines Screen, Ur  <500 ng/mL Negative Negative Negative Negative Negative Negative Negative    Barbiturates Screen, Ur  <200 ng/mL Negative Negative Negative Negative Negative Negative Negative    Benzodiazepines Screen, Urine  <200 ng/mL Negative Negative Negative Positive Abnormal  Positive Abnormal  Negative Negative    Cannabinoids Screen, Ur  <20 ng/mL Positive Abnormal  Negative Negative Positive Abnormal  Positive Abnormal  Negative Negative    Methadone Screen, Urine  <300 ng/mL Negative Negative Negative Negative Negative Negative Negative    Cocaine(Metab.)Screen, Urine  <150 ng/mL Positive Abnormal  Positive Abnormal  Positive Abnormal  Positive Abnormal  Positive Abnormal  Positive Abnormal  Positive Abnormal     Opiates Screen, Ur  <300 ng/mL Negative Negative Negative Negative Negative Negative Negative    Fentanyl Screen, Ur  <1.0 ng/mL Negative Negative Negative Negative Negative Negative Negative    Oxycodone Screen, Ur  <100 ng/mL Negative Positive Abnormal  Negative Negative Negative Negative Negative    Buprenorphine, Urine  <5 ng/mL Positive Abnormal  Positive Abnormal  Positive Abnormal  Positive Abnormal  Positive Abnormal  Positive Abnormal  Positive Abnormal    Resulting Agency [place] [facility] [place] [facility] [place] [facility] [place] [facility] [place] [facility]

## 2023-05-27 NOTE — Unmapped (Signed)
Chart reviewed, pt rescheduled for 10/2 for likely transition from oral to LAI.  Will send in  bridge for her.

## 2023-05-27 NOTE — Unmapped (Signed)
The PAC has received an incoming call requesting medication refill/clarification/question:    Caller: Ashley Williamson  Best callback number: [phone]    Type of request (refill, clarification, question): refill    Name of medication: buprenorphine (SUBLOCADE) 100 mg/0.5 mL slsy     Is there a preferred amount requested (e.g. 30 pills, 3 months worth - if no leave blank): she is completely out.     Desired pharmacy:   [facility]  [address]  [place] [address]  Phone: [phone] Fax: [phone]    Does caller request a callback from a nurse to discuss?: no    Jcano  05/27/23

## 2023-05-28 ENCOUNTER — Telehealth: Admit: 2023-05-28 | Discharge: 2023-05-29 | Payer: PRIVATE HEALTH INSURANCE

## 2023-05-28 ENCOUNTER — Ambulatory Visit
Admit: 2023-05-28 | Discharge: 2023-05-29 | Payer: PRIVATE HEALTH INSURANCE | Attending: Internal Medicine | Primary: Internal Medicine

## 2023-05-28 DIAGNOSIS — I10 Essential (primary) hypertension: Principal | ICD-10-CM

## 2023-05-28 DIAGNOSIS — G8929 Other chronic pain: Principal | ICD-10-CM

## 2023-05-28 DIAGNOSIS — E1169 Type 2 diabetes mellitus with other specified complication: Principal | ICD-10-CM

## 2023-05-28 DIAGNOSIS — M5442 Lumbago with sciatica, left side: Principal | ICD-10-CM

## 2023-05-28 MED ORDER — BLOOD GLUCOSE TEST STRIPS
ORAL_STRIP | 11 refills | 0 days | Status: CP
Start: 2023-05-28 — End: 2024-05-27
  Filled 2023-05-28: qty 50, 30d supply, fill #0

## 2023-05-28 MED ORDER — BLOOD-GLUCOSE METER KIT WRAPPER
0 refills | 0 days | Status: CP
Start: 2023-05-28 — End: ?
  Filled 2023-05-28: qty 1, 30d supply, fill #0

## 2023-05-28 MED ORDER — LANCETS
11 refills | 0 days | Status: CP
Start: 2023-05-28 — End: 2024-05-27
  Filled 2023-05-28: qty 100, 30d supply, fill #0

## 2023-05-28 NOTE — Unmapped (Signed)
[facility] Internal Medicine at [facility]     Are you located in ? yes    Reason for visit: Follow up        PTHomeBP     HCDM reviewed and updated in Epic:    We are working to make sure all of our patients??? wishes are updated in Epic and part of that is documenting a Environmental health practitioner for each patient  A Health Care Decision Maker is someone you choose who can make health care decisions for you if you are not able - who would you most want to do this for you????  is already up to date.    HCDM (patient stated preference): Burtis,Matt - Son - [phone]    HCDM, First AlternateDerrel Nip - Sister - [phone]    BPAs completed:  PHQ9    __________________________________________________________________________________________    SCREENINGS COMPLETED IN FLOWSHEETS      AUDIT       PHQ2       PHQ9          GAD7       COPD Assessment       Falls Risk

## 2023-05-28 NOTE — Unmapped (Signed)
Internal Medicine Video Visit    This visit is conducted via video conferencing.    Contact Information  Person Contacted: Patient  Contact Phone number: [phone] (home)   Is there someone else in the room? No.   Patient agreed to a video visit    Ashley Williamson is a 62 y.o. female  participating in a video visit.    Patient Active Problem List    Diagnosis Date Noted    Coronary artery disease of native artery of native heart with stable angina pectoris (CMS-HCC) 11/21/2020    Claudication of both lower extremities (CMS-HCC) 11/20/2020    Tobacco use disorder 10/23/2020    Opioid use disorder, severe, dependence (CMS-HCC) 10/20/2020    Hypertensive urgency 10/19/2020    Chest pain 10/19/2020    Carpal tunnel syndrome 04/26/2014    Hypertension 09/15/2013    Diabetes (CMS-HCC) 05/19/2013    Trigger finger 05/19/2013    Hyperlipidemia 05/19/2013    Depression 05/19/2013    Chronic airway obstruction (CMS-HCC) 07/29/2012    Opioid dependence (CMS-HCC) 07/29/2012    Polyneuropathy in diabetes (CMS-HCC) 01/07/2012    Chronic pain 06/19/2010       Reason for visit:  Follow up    Subjective:  Ashley Williamson is a 62 yo female with PMH CAD, opioid use disorder, HTN, DM II, COPD, chronic pain, neuropathy and tobacco abuse on video for follow up.    Had a lung scan and missed the appointment.  Forgets appointments and misses a lot of appointment    Back has been hurting.  Fell downstairs stairs 6-7 months ago.  Has a deep pain where she fell    Low back and on left side goes into butt cheeks    Thought maybe sciatica or pinched nerve    Sometimes legs feel.  No numbness in pelvis.  Sometimes leaks urine but no bowel accidents    Not taking anything but tylenol and back and body pain med by [employer] helps a little    If not doing much doesn't bother her.  If moving hurts more.  Feels like a deep bruise in back    Not checking glucose at home, would like to.  Has an older meter    Not checking bp at home, would be open to that. No sob, no cough.  Chest aches if doing more activity.  Rests and goes away.  Has used nitroglycerin a few times.  Asks how often she can use that.    I have reviewed the problem list, medications, and allergies and have updated/reconciled them if needed.    Objective:  Gen well appearing in NAD  Speech is clear  She is lying or sitting comfortably in her room   Breathing does not appear labored         PHQ-9 Score:  PHQ-9 TOTAL SCORE: 6  GAD-7 Score:         Assessment & Plan:     Diagnosis ICD-10-CM Associated Orders   1. Chronic left-sided low back pain with left-sided sciatica  M54.42 XR Lumbar Spine 2 or 3 Views    G89.29       2. Primary hypertension  I10 Home Medical Equipment      3. Type 2 diabetes mellitus with other specified complication, without long-term current use of insulin (CMS-HCC)  E11.69 blood-glucose meter kit     blood sugar diagnostic (GLUCOSE BLOOD) Strp     lancets Misc        Low  back pain  Fall 6 mo ago  Worsened back pain since then  Xray lumbar  We reveiwed warning signs of low back pain including increasing severity, weakness in legs, saddle anesthesia or bowel/bladder incontinence    HTN  Cont current regimen  Home bp cuff ordered  Blood Pressure Monitor Documentation    Patient would benefit from using an automatic blood pressure monitor at home to help with management of hypertension.    DM II  Lab Results   Component Value Date    A1C 7.6 (H) 03/26/2023   Cont trulicity and metformin  A1c in Oct    The patient reports they are physically located in [place] and is currently: at home. I conducted a audio/video visit. I spent  41m 05s on the video call with the patient. I spent an additional 7 minutes on pre- and post-visit activities on the date of service .

## 2023-05-30 NOTE — Unmapped (Signed)
Patient did not keep appointment.

## 2023-06-03 MED ORDER — COURIERED MED OR SUPPLY
0 refills | 0 days
Start: 2023-06-03 — End: ?

## 2023-06-03 MED FILL — SUBLOCADE 100 MG/0.5 ML SOLUTION,EXTENDED RELEASE SUBCUTANEOUS SYRINGE: SUBCUTANEOUS | 28 days supply | Qty: 0.5 | Fill #0

## 2023-06-03 NOTE — Unmapped (Unsigned)
PAIN CLINIC  Last visit date: 04/29/2023  MOUD contract last signed: 01/08/2021 (EXPIRED)  Narcan Rx: 04/29/2023  Last Utox/Opioid Confirmation: 03/26/2023    Toxicology Screen, Urine               Component  Ref Range & Units 03/26/23 1606 12/12/22 1518 06/26/22 1641 05/15/22 1630 04/17/22 1336 11/21/21 1525 09/26/21 1534    Amphetamines Screen, Ur  <500 ng/mL Negative Negative Negative Negative Negative Negative Negative    Barbiturates Screen, Ur  <200 ng/mL Negative Negative Negative Negative Negative Negative Negative    Benzodiazepines Screen, Urine  <200 ng/mL Negative Negative Negative Positive Abnormal  Positive Abnormal  Negative Negative    Cannabinoids Screen, Ur  <20 ng/mL Positive Abnormal  Negative Negative Positive Abnormal  Positive Abnormal  Negative Negative    Methadone Screen, Urine  <300 ng/mL Negative Negative Negative Negative Negative Negative Negative    Cocaine(Metab.)Screen, Urine  <150 ng/mL Positive Abnormal  Positive Abnormal  Positive Abnormal  Positive Abnormal  Positive Abnormal  Positive Abnormal  Positive Abnormal     Opiates Screen, Ur  <300 ng/mL Negative Negative Negative Negative Negative Negative Negative    Fentanyl Screen, Ur  <1.0 ng/mL Negative Negative Negative Negative Negative Negative Negative    Oxycodone Screen, Ur  <100 ng/mL Negative Positive Abnormal  Negative Negative Negative Negative Negative    Buprenorphine, Urine  <5 ng/mL Positive Abnormal  Positive Abnormal  Positive Abnormal  Positive Abnormal  Positive Abnormal  Positive Abnormal  Positive Abnormal    Resulting Agency [place] [facility] [place] [facility] [place] [facility] [place] [facility] [place] [facility] [place] [facility]

## 2023-06-04 MED FILL — TRULICITY 1.5 MG/0.5 ML SUBCUTANEOUS PEN INJECTOR: SUBCUTANEOUS | 28 days supply | Qty: 2 | Fill #0

## 2023-06-04 NOTE — Unmapped (Signed)
MOUD FOLLOW-UP ENCOUNTER  Date of Service:  06/04/23   Service:  Care Coordination - phone  MyChart use by patient is active: yes    Post-outreach Action Items:  Provider: No/none.  CM: No/none.  Patient:  Follow up with MOUD CM  regarding barriers to care and ongoing interest in [facility] treatment.    Purpose of contact:     LCAS received request from MOUD provider to follow up with patient to assess barriers to care  as  patient rescheduled  her MOUD follow up scheduled  for today. Patient was scheduled for Sublocade injection during today's scheduled visit.    LCAS completed the following related to the above request:  Reviewed chart  Attempted to contacted Scot Dock   The Internal Medicine CCM team was unable to reach Lurlean Nanny to follow-up in regards to  MOUD appointment and to assess barriers to care . A voicemail was left with a request to return our call.     Sent patient Mychart  requesting  follow up with MOUD CM    Patient rescheduled appointment to 06/11/23 @3 :30pm    Patient was encouraged to reach out to their provider with any questions or concerns.    A copy of this Patient Outreach Encounter was sent to patient's Primary Care Provider

## 2023-06-11 ENCOUNTER — Ambulatory Visit
Admit: 2023-06-11 | Discharge: 2023-06-12 | Payer: PRIVATE HEALTH INSURANCE | Attending: Internal Medicine | Primary: Internal Medicine

## 2023-06-11 DIAGNOSIS — J449 Chronic obstructive pulmonary disease, unspecified: Principal | ICD-10-CM

## 2023-06-11 DIAGNOSIS — E1169 Type 2 diabetes mellitus with other specified complication: Principal | ICD-10-CM

## 2023-06-11 DIAGNOSIS — F1120 Opioid dependence, uncomplicated: Principal | ICD-10-CM

## 2023-06-11 LAB — TOXICOLOGY SCREEN, URINE
AMPHETAMINE SCREEN URINE: NEGATIVE
BARBITURATE SCREEN URINE: NEGATIVE
BENZODIAZEPINE SCREEN, URINE: NEGATIVE
BUPRENORPHINE, URINE SCREEN: POSITIVE — AB
CANNABINOID SCREEN URINE: NEGATIVE
COCAINE(METAB.)SCREEN, URINE: POSITIVE — AB
METHADONE SCREEN, URINE: NEGATIVE
OPIATE SCREEN URINE: NEGATIVE
OXYCODONE SCREEN URINE: NEGATIVE

## 2023-06-11 MED ORDER — BUPRENORPHINE 8 MG-NALOXONE 2 MG SUBLINGUAL TABLET
ORAL_TABLET | Freq: Two times a day (BID) | SUBLINGUAL | 0 refills | 30 days | Status: CP
Start: 2023-06-11 — End: 2023-07-11
  Filled 2023-06-11: qty 60, 30d supply, fill #0

## 2023-06-11 NOTE — Unmapped (Addendum)
ASSESSMENT / PLAN:          Plan  Patient IMCMATSTABILITY: is vulnerable on maintenance therapy Refer to [facility] and Other1 month virtual follow up with me    PRESCRIPTIONS GIVEN TODAY:    Medications ordered during this encounter   Medications    buprenorphine-naloxone (SUBOXONE) 8-2 mg sublingual tablet     Sig: Place 1 tablet (8 mg of buprenorphine total) under the tongue two (2) times a day.     Dispense:  60 tablet     Refill:  0      - Benefits of treatment outweigh the risks  - Utox today: will be positive [id number], buprenorphine  - PDMP reviewed: Appropriate on review today  - Next appt: 1 month virtual, also schedule with PCP for back pain evaluation  - Next PCP appointment scheduled will try to schedule today   - needs ECCM referral?  Yes, discussed    Would like LAI but not able to yet provide regularly in clinic due to work flow barriers.  Will re-assess in future. Discussed with patient. She is hoping to change given doesn't like taste or daily dosing.   Patient Consent Status   During today's visit: Patient consented to Embedded Chronic Care Management by verbal consent.        Chronic Obstructive Pulmonary Disease (COPD)                      Please get a flu shot every year.   Make sure you have received a pneumonia shot.  If you are not sure of your status please ask me or my nurse to look into it.  It is important to take your prescribed inhalers as directed.  Doing this will improve your breathing and help to keep you out of the hospital.  Let me know if you have questions about how to use your inhalers.   If you are having increased shortness of breath or cough, let me know immediately.  Avoid cigarette smoke.     ,      Tobacco Use                      Avoid the use of tobacco containing products  If you need help to quit smoking, please discuss options with your healthcare provider  Ask family, friends, and coworkers for support  Join a support group for people who are trying to quit smoking  Set a quit date  Consider signing up for a smoking cessation program.  Talk with your healthcare provider for more details  Ask your doctor about various medications that can help you to quit smoking  Eat a healthy diet and get regular exercise  Find alternative ways of relieving your stress.       Patient Consent Status   During today's visit: Patient consented to Embedded Chronic Care Management by verbal consent.        Substance use disorder                                 - Does pt have naloxone at home and know how to use it?  yes  - Labs needed today? Utox    - Buprenorphine treatment agreement:  has been signed by patient and scanned into record [facility])      SUBJECTIVE:      History of Present Illness  Ashley Williamson is a 61 y.o. female who presents for OUD follow up:     Our last visit was about 1.5 months ago virtually.  She felt she was coming down with a viral infection feeling malaized tat that time.  She was struggling with dysgeusia with buprenorphine and asking for LAI.      She has pain on her left lower back. She had fallen in 09/2022.  Continues to have a deep pain there, worse with activity.  Wants to pursue this at the next visit. Is pressed for time as a friend is taking her home today, though she does have.      Smoking about a pack per day, more when stressed.     Has a lot of stress with her roommate who uses cocaine, they are smoking this regularly. She is concerned that her roommate is taking this from her.  She is now more careful to keep them with her, even when sleeping.     She would like to find a new housing situation, is number 120 on the list for affordable housing.          - Pt is currently taking buprenorphine/naloxone SL film at a dose of 8mg  BID.  - Cravings? occasional  - Side effects? Daily, doesn't like taste  - Withdrawal symptoms?None  - Any medication left over today? no  - Any non-prescribed substance use since last visit?  cocaine  - Attending: No therapy/meetings currently      Past Medical, Family, and Social History    I have reviewed the patients problem list, current medications, allergies, and social history and updated them as needed.    REVIEW OF SYSTEMS  No nausea, constipation, weight changes    Medications, PMH and allergies reviewed and updated.       OBJECTIVE:     BP 196/87 (BP Site: L Arm, BP Position: Sitting, BP Cuff Size: Small)  - Pulse 81  - Temp 35.8 ??C (96.5 ??F) (Temporal)  - Resp 18  - Ht 160 cm (5' 3)  - Wt 73.4 kg (161 lb 12.8 oz)  - SpO2 96%  - BMI 28.66 kg/m??        Physical Examination    General appearance - alert, well appearing, and in no distress, intermittently tearful  Mental status - alert, oriented to person, place, and time  Eyes - anicteric  Mouth - mucous membranes moist  Neurological - alert, oriented, normal speech      I personally spent 45 minutes face-to-face and non-face-to-face in the care of this patient, which includes all pre, intra, and post visit time including 15 minutes spend in direct collaboration with LCSW, LCAS  on the date of service.  All documented time was specific to the E/M visit and does not include any procedures that may have been performed.

## 2023-06-11 NOTE — Unmapped (Signed)
PAIN CLINIC  Last visit date: 04/29/2023  MOUD contract last signed: 01/08/2021 (EXPIRED)  Narcan Rx: 04/29/2023  Last Utox/Opioid Confirmation: 03/26/2023 (order pended)     Toxicology Screen, Urine                        Component  Ref Range & Units 03/26/23 1606 12/12/22 1518 06/26/22 1641 05/15/22 1630 04/17/22 1336 11/21/21 1525 09/26/21 1534    Amphetamines Screen, Ur  <500 ng/mL Negative Negative Negative Negative Negative Negative Negative    Barbiturates Screen, Ur  <200 ng/mL Negative Negative Negative Negative Negative Negative Negative    Benzodiazepines Screen, Urine  <200 ng/mL Negative Negative Negative Positive Abnormal  Positive Abnormal  Negative Negative    Cannabinoids Screen, Ur  <20 ng/mL Positive Abnormal  Negative Negative Positive Abnormal  Positive Abnormal  Negative Negative    Methadone Screen, Urine  <300 ng/mL Negative Negative Negative Negative Negative Negative Negative    Cocaine(Metab.)Screen, Urine  <150 ng/mL Positive Abnormal  Positive Abnormal  Positive Abnormal  Positive Abnormal  Positive Abnormal  Positive Abnormal  Positive Abnormal     Opiates Screen, Ur  <300 ng/mL Negative Negative Negative Negative Negative Negative Negative    Fentanyl Screen, Ur  <1.0 ng/mL Negative Negative Negative Negative Negative Negative Negative    Oxycodone Screen, Ur  <100 ng/mL Negative Positive Abnormal  Negative Negative Negative Negative Negative    Buprenorphine, Urine  <5 ng/mL Positive Abnormal  Positive Abnormal  Positive Abnormal  Positive Abnormal  Positive Abnormal  Positive Abnormal  Positive Abnormal    Resulting Agency [place] [facility] [place] [facility] [place] [facility] [place] [facility] [place] [facility] [place] [facility] MCL

## 2023-06-11 NOTE — Unmapped (Signed)
[facility]   Influenza Vaccine low dose left deltoid  Reason for visit:  [place]    Questions / Concerns that need to be addressed:    Screening BP    [employer] BPs (complete if screening BP has a systolic  > 130 or diastolic > 80)  BP#1 199/90   BP#2 195/87  BP#3 193/84    Average BP 196/87 P 81  (please note this as a comment in vitals)     PTHomeBP         HCDM reviewed and updated in Epic:    We are working to make sure all of our patients??? wishes are updated in Epic and part of that is documenting a Environmental health practitioner for each patient  A Health Care Decision Maker is someone you choose who can make health care decisions for you if you are not able - who would you most want to do this for you????  was updated.    HCDM (patient stated preference): Monnier,Matt - Son - [phone]    HCDM, First AlternateDerrel Nip - Sister - [phone]    BPAs completed:  Influenza vaccine    Annual Screenings:   Tobacco  __________________________________________________________________________________________    SCREENINGS COMPLETED IN FLOWSHEETS      AUDIT       PHQ2       PHQ9          GAD7       COPD Assessment       Falls Risk

## 2023-06-11 NOTE — Unmapped (Addendum)
Transportation Resources:    [employer]?   Call?One Call?at?[phone]?or [employer] Member Services?at?[phone] (TTY: [phone])?and select option for ???TRANSPORTATION??? ?   Information:?   Call up to 2 business days before your appointment??   If your need is urgent, select ???Urgent Trip Request??? when you call.?   Have your member ID and any other important papers with you. ?   Drivers will wait up to 5 minutes after arrival for pickup.??   For more information, visit: [email or web address].co.[email or web address]            1)Call [employer] to ask about care management resources, especially regarding housing.       [facility] Housing Resources for [employer]  [address] [place]  [phone]  Open Mon-Fri 8am-5pm https://www.burlingtonha.org  Offers Public Housing      [employer]  Physical Address: [address] [place], [place]  Mailing address: [address] [place], [place] [address]  Phone: [phone] [email or web address]  Fax [phone]  Business Hours M-Th 8a-4p; Fri 8a-2p; Lunch daily 11:45-1pm  Copy Disabled Housing in [place], Section 8 Housing Choice Vouchers in [place]  Currently accepting applications for all programs, Pick up current application at address    [facility]  [address] [place], [place] [address]  [phone]  M,T,TH, & Fri 9a-3p  Food Pantry Hours M,T,Fri 9a-3p www.[email or web address]  Offers housing services, congregate meal programs, emergency food assistance, housing counseling, home ownership program, wheels to work program, Free meals offered for individuals 60+ at various locations on Mondays, Tuesdays, and Fridays from 9am-1p  Currently accepting applications for all programs; Pick up current application at [employer] (Adult Service???s Information Line): [phone]   Website: [email or web address]-[email or web address]-[email or web address]-adults/   Contact the Adult Service???s Information Line for more information about programs and services.

## 2023-06-16 NOTE — Unmapped (Signed)
Addended by: Jeannette Corpus on: 06/16/2023 12:48 PM     Modules accepted: Orders

## 2023-07-02 NOTE — Unmapped (Signed)
Ashley Williamson is requesting the following refill  Requested Prescriptions      No prescriptions requested or ordered in this encounter   Rx #: [id number]  buprenorphine-naloxone (SUBOXONE) 8-2 mg sublingual tablet [[id number]]    Order Details    Dose: 1 tablet Route: Sublingual Frequency: 2 times a day (standard)   Dispense Quantity: 60 tablet Refills: 0 Fills remaining: 0         Sig: Place 1 tablet (8 mg of buprenorphine total) under the tongue two (2) times a day.             Last refill given on: 06/08/23 with 60 count and 0 refills.     Recent Visits  Date Type Provider Dept   06/11/23 Office Visit Jeannette Corpus, MD [facility] Internal Medicine [facility]   05/28/23 Telemedicine Leda Min, [place] [facility] Internal Medicine [facility]   04/29/23 Telemedicine [place], Corrie Mckusick, MD [facility] Internal Medicine [facility]   03/26/23 Office Visit Leda Min, [place] [facility] Internal Medicine [facility]   03/26/23 Office Visit Jeannette Corpus, MD [facility] Internal Medicine [facility]   12/26/22 Office Visit Jeannette Corpus, MD [facility] Internal Medicine [facility]   12/12/22 Office Visit [place], Clelia Croft, MD [facility] Internal Medicine [facility]   10/23/22 Telemedicine [place], Corrie Mckusick, MD [facility] Internal Medicine [facility]   10/09/22 Telemedicine [place], Corrie Mckusick, MD [facility] Internal Medicine [facility]   07/24/22 Telemedicine [place], Corrie Mckusick, [place] Internal Medicine [facility]   Showing recent visits within past 365 days and meeting all other requirements  Future Appointments  Date Type Provider Dept   07/16/23 Appointment Jeannette Corpus, MD [place] Internal Medicine [facility]   Showing future appointments within next 365 days and meeting all other requirements       Opioid Monitoring   Urine Tox Screen Last Drug Screen Date: 06/11/2023  Opiate Confirmation Test Last Drug Screen Date: Not Found  Last Opioid Dispensed Provider: Melanee Spry, AGNP  Prescribed MEDD : 295621  Last PDMP Review: 06/11/2023  4:36 PM  Last Opioid Pain Agreement Signed Date: Not Found  Last Non Opioid Controlled Substance Pain Agreement Signed Date: Not Found    Naloxone Ordered: 04/29/2023  Last OV: 06/11/2023

## 2023-07-03 NOTE — Unmapped (Signed)
1st attempt unable to contact patient by phone but leave a vm.    Per Dr Shelva Majestic: I sent her a bridge until 11/22, can she be offered a reschedule before then if she needs to cancel the 11/13 appointment? Thanks. Can be virtual if needed.   Alan Ripper

## 2023-07-03 NOTE — Unmapped (Signed)
I have received your message/request and the admin team is working on this!      See encounter .

## 2023-07-07 NOTE — Unmapped (Signed)
[facility] Internal Medicine   EMBEDDED CARE MANAGEMENT OUTREACH ENCOUNTER           Date of Service:  07/07/2023      Service:  Care Coordination - phone  Is there someone else in the room? No.   MyChart use by patient is active:  inconsistent     Post-outreach Action Items:  Provider: N/A.  CM: N/A.  Patient: N/A.    Follow-up Next Call: continue attempt to reach for enrollment call     Embedded Care Management [facility]) Outreach  Care Manager (CM) completed the following:  Reviewed chart  Identified following:   05/28/23 video appt -     Low back pain  Fall 6 mo ago  Worsened back pain since then  Xray lumbar  We reveiwed warning signs of low back pain including increasing severity, weakness in legs, saddle anesthesia or bowel/bladder incontinence   HTN  Cont current regimen  Home bp cuff ordered  Blood Pressure Monitor Documentation   Patient would benefit from using an automatic blood pressure monitor at home to help with management of hypertension.     DM II        Lab Results   Component Value Date     A1C 7.6 (H) 03/26/2023   Cont trulicity and metformin  A1c in Oct  10/9 MOUD appt -  Utox today: will be positive [id number], buprenorphine  - PDMP reviewed: Appropriate on review today  - Next appt: 1 month virtual, also schedule with PCP for back pain evaluation  - Next PCP appointment scheduled will try to schedule today   - needs [facility] referral?  Yes, discussed     Would like LAI but not able to yet provide regularly in clinic due to work flow barriers.  Will re-assess in future. Discussed with patient. She is hoping to change given doesn't like taste or daily dosing  Placed call to Ms. Weingart regarding enrollment in ECM. -  The Internal Medicine Enhanced Care team was unable to reach Lurlean Nanny to follow-up with the patient in regards to the Chronic Care Management program. We have made one attempts to contact patient. A voicemail was left with a request to return our call.         Future Appointments   Date Time Provider Department Center   07/16/2023  2:40 PM Jeannette Corpus, MD [facility]       A copy of this Patient Outreach Encounter was sent to patient's Primary Care Provider

## 2023-07-08 NOTE — Unmapped (Signed)
[facility] Internal Medicine   EMBEDDED CARE MANAGEMENT OUTREACH ENCOUNTER           Date of Service:  07/08/2023      Service:  Care Coordination - phone  Is there someone else in the room? No.   MyChart use by patient is active:  inconsistent     Post-outreach Action Items:  Provider: N/A.  CM: N/A.  Patient: N/A.       Follow-up Next Call: continue attempt to reach for enrollment call   The Internal Medicine Enhanced Care team was unable to reach Ashley [place] to follow-up with the patient in regards to the Chronic Care Management program. We have made two attempts to contact patient. A voicemail was left with a request to return our call.     Planned to discuss:       Embedded Care Management [facility]) Outreach  Care Manager (CM) completed the following:  Reviewed chart  Identified following:   05/28/23 video appt -     Low back pain  Fall 6 mo ago  Worsened back pain since then  Xray lumbar  We reveiwed warning signs of low back pain including increasing severity, weakness in legs, saddle anesthesia or bowel/bladder incontinence   HTN  Cont current regimen  Home bp cuff ordered  Blood Pressure Monitor Documentation   Patient would benefit from using an automatic blood pressure monitor at home to help with management of hypertension.     DM II            Lab Results   Component Value Date     A1C 7.6 (H) 03/26/2023   Cont trulicity and metformin  A1c in Oct  10/9 [facility] appt -  Utox today: will be positive [id number], buprenorphine  - PDMP reviewed: Appropriate on review today  - Next appt: 1 month virtual, also schedule with PCP for back pain evaluation  - Next PCP appointment scheduled will try to schedule today   - needs [facility] referral?  Yes, discussed     Would like [facility] but not able to yet provide regularly in clinic due to work flow barriers.  Will re-assess in future. Discussed with patient. She is hoping to change given doesn't like taste or daily dosing      Future Appointments   Date Time Provider Department Center   07/16/2023  2:40 PM Jeannette Corpus, MD [facility]       A copy of this Patient Outreach Encounter was sent to patient's Primary Care Provider

## 2023-07-09 MED ORDER — BUPRENORPHINE 8 MG-NALOXONE 2 MG SUBLINGUAL TABLET
ORAL_TABLET | Freq: Two times a day (BID) | SUBLINGUAL | 0 refills | 16 days | Status: CP
Start: 2023-07-09 — End: 2023-07-25

## 2023-07-16 ENCOUNTER — Telehealth
Admit: 2023-07-16 | Discharge: 2023-07-17 | Payer: PRIVATE HEALTH INSURANCE | Attending: Internal Medicine | Primary: Internal Medicine

## 2023-07-16 DIAGNOSIS — F1410 Cocaine abuse, uncomplicated: Principal | ICD-10-CM

## 2023-07-16 DIAGNOSIS — F1121 Opioid dependence, in remission: Principal | ICD-10-CM

## 2023-07-16 DIAGNOSIS — F1310 Sedative, hypnotic or anxiolytic abuse, uncomplicated: Principal | ICD-10-CM

## 2023-07-16 DIAGNOSIS — Z59819 Housing instability: Principal | ICD-10-CM

## 2023-07-16 DIAGNOSIS — J449 Chronic obstructive pulmonary disease, unspecified: Principal | ICD-10-CM

## 2023-07-16 DIAGNOSIS — F1120 Opioid dependence, uncomplicated: Principal | ICD-10-CM

## 2023-07-16 DIAGNOSIS — E1169 Type 2 diabetes mellitus with other specified complication: Principal | ICD-10-CM

## 2023-07-16 MED ORDER — BUPRENORPHINE 8 MG-NALOXONE 2 MG SUBLINGUAL TABLET
ORAL_TABLET | Freq: Two times a day (BID) | SUBLINGUAL | 0 refills | 30 days | Status: CP
Start: 2023-07-16 — End: 2023-08-15
  Filled 2023-07-18: qty 60, 30d supply, fill #0

## 2023-07-16 NOTE — Unmapped (Signed)
Managed Care Organizations  An MCO is an organization for patients who either have Medicaid or who are uninsured. The MCOs listed below help residents of [place] find mental health and substance abuse services.    [facility] -- Call to check on options for inpatient treatment.   Address: [address], [place], [place] [address]  Phone (24/7 Support): [phone]  Crisis Line: [phone]  Fax: [phone]  [place]: [place], [place], [place], [place], [place], [place], [place]   Website: AdSolver.gl          [place] Free Inpatient/Residential Treatment Resources:   Residential treatment is a type of substance use care where an individual checks into a facility/organization where they stay for more than on average 2 weeks. During this time, a patient will stay within a facility in a controlled environment that includes regular therapy and treatment services offered in-house. The residential treatment centers listed below are either free of charge for every patient or have several low/no-cost options for admission.       [facility]   [address] [place], [place], [place] [address]   Phone: [phone]   Website: [email or web address]://www.BargainMaintenance.cz       [facility]   [address], [place], [place] [address]   Phone: [phone]   Counties Served: All   Insurance: Any Insurance   Website: ResidentialLock.ch       [facility]   [address] [place], [place] [address]   Phone: [phone]   Website: [email or web address]       [facility]   [address] [place], [place] [address]   Phone: [phone]   Website: [email or web address]       [facility]   [address] [place], [address]   Phone: [phone]   Website: [email or web address].[email or web address]/service/walk-in-[email or web address]       [facility]    [address] [place], [place] [address]   Phone: [phone]   Website: [email or web address]://www.[email or web address].net/about-us-[email or web address]

## 2023-07-16 NOTE — Unmapped (Signed)
Internal Medicine Video Visit    This visit is conducted via video conferencing.    Contact Information  Person Contacted: Patient  Contact Phone number: [phone] (home)   Is there someone else in the room? No.   Patient agreed to a video visit    Ash[place] is a 62 y.o. female  participating in a video visit.    ASSESSMENT / PLAN:          Plan  Patient IMCMATSTABILITY: is vulnerable on maintenance therapy next visit in person    PRESCRIPTIONS GIVEN TODAY:  No orders of the defined types were placed in this encounter.     - Benefits of treatment outweigh the risks  - Utox history reviewed, discussed.   - PDMP reviewed: Appropriate on review today  - Needs naloxone?no  - Next appt: 08/20/23 at 3:05    Polysubstance use disorder  Eager for a change  She is moving up on the housing list  I encourage residential substance use treatment, she would like to do this and wants options.         Other medical problems will be monitored by Ashley Williamson, Ashley Williamson     SUBJECTIVE:      History of Present Illness    Ashley Williamson is a 62 y.o. female mother, grandmother who sees [place], [place] for primary care who presents for OUD follow up:     Our last in person visit was 1 month ago.  She had fallen last year and was having more low back pain. Smoking about 1 ppd of cigarettes.  Continued to struggle with a difficult housing situation with a roommate with SUD (cocaine).  She had barriers to finding healthier housing which we had discussed.   Her UDS was expected with bupe and cocaine only.      She continues to struggle with housing instability. Was hoping to move in with a friend down on the cost.  This didn't pan out.      She is feeling a bit better in terms of the likely URI she had last month.     She continues to have conflict with her current roommate, who had initially tried to evict her but ultimately had agreed to let her stay until she has housing.  She is frustrated with ongoing exposure to cocaine in the house. She is trying to avoid contacts that she thinks might trigger her to use.  She was #116 on the housing list, up from 300s this year.     Her back continues to hurt her.     She is open     - Pt is currently taking buprenorphine/naloxone SL film at a dose of 8mg  BID.  - Cravings? none  - Side effects? none  - Any medication left over today? no  - Withdrawal symptoms?None  - Any non-prescribed substance use since last visit?  Cocaine,  quite often,  also clonazepam (in setting of stress of possibly losing her housing).    - Does pt have naloxone at home and know how to use it?  yes  - Attending: No therapy/meetings currently      Past Medical, Family, and Social History    I have reviewed the patients problem list, current medications, allergies, and social history and updated them as needed.      REVIEW OF SYSTEMS  No nausea, constipation, weight changes    Medications, PMH and allergies reviewed and updated.  1/23: staying with her mom for now.  Her brother is handicapped and she cares for him as well at their house as well as her great niece.    3/23:  mom passed in 1/23, she is living with her son now. Granddaughter [place] Williamson comes to visit.                                           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   9/23:  lost job at [employer], looking for new work.     OBJECTIVE:     Ht 160 cm (5' 3)  - Wt 73 kg (161 lb)  - BMI 28.52 kg/m??      Wt Readings from Last 3 Encounters:   07/16/23 73 kg (161 lb)   06/11/23 73.4 kg (161 lb 12.8 oz)   05/28/23 68 kg (150 lb)       Physical Examination    General appearance - alert, well appearing, and in no distress, intermittently tearful, mildly fatigued.   Mental status - alert, oriented to person, place, and time  Eyes - anicteric  Mouth - mucous membranes moist  Neurological - alert, oriented, normal speech              The patient reports they are physically located in [place] and is currently: at home. I conducted a audio/video visit. I spent  34m 03s on the video call with the patient. I spent an additional 10 minutes on pre- and post-visit activities on the date of service .

## 2023-07-16 NOTE — Unmapped (Signed)
[facility] Internal Medicine at [facility]     Are you located in [place]? yes    Reason for visit:  [place]    Questions / Concerns that need to be addressed:     PTHomeBP         HCDM reviewed and updated in Epic:    We are working to make sure all of our patients??? wishes are updated in Epic and part of that is documenting a Environmental health practitioner for each patient  A Health Care Decision Maker is someone you choose who can make health care decisions for you if you are not able - who would you most want to do this for you????  was updated.    HCDM (patient stated preference): Lacuesta,Matt - Son - [phone]    HCDM, First AlternateDerrel Nip - Sister - [phone]    BPAs completed:      __________________________________________________________________________________________    SCREENINGS COMPLETED IN FLOWSHEETS      AUDIT       PHQ2       PHQ9          GAD7       COPD Assessment       Falls Risk

## 2023-07-16 NOTE — Unmapped (Addendum)
 [facility] Internal Medicine   EMBEDDED CARE MANAGEMENT OUTREACH ENCOUNTER           Date of Service:  07/16/2023      Service:  Care Coordination -  letter   Is there someone else in the room? No.   MyChart use by patient is active: no    Post-outreach Action Items:  Provider: N/A.  CM: N/A.  Patient:  pls reach out if you wish to enroll into ECM .    Letter placed in mail today to reach out to patient to attempt to contact her for ECM enrollment.  Attempted to call to enroll in [facility] on 11/4 and 11/5 left messages without callback.     Future Appointments   Date Time Provider Department Center   07/16/2023  2:40 PM Jeannette Corpus, MD [facility]       A copy of this Patient Outreach/ECM Encounter was sent to patient's Primary Care Provider

## 2023-07-16 NOTE — Unmapped (Signed)
PAIN CLINIC  Last visit date: 06/11/2023  [facility] contract last signed: 06/11/2023  Narcan Rx: 04/29/2023  Last Utox/Opioid Confirmation: 06/11/2023    Toxicology Screen, Urine               Component  Ref Range & Units 06/11/23 1553 03/26/23 1606 12/12/22 1518 06/26/22 1641 05/15/22 1630 04/17/22 1336 11/21/21 1525    Amphetamines Screen, Ur  Negative Negative Negative R Negative R Negative R Negative R Negative R Negative R    Barbiturates Screen, Ur  Negative Negative Negative R Negative R Negative R Negative R Negative R Negative R    Benzodiazepines Screen, Urine  Negative Negative Negative R Negative R Negative R Positive Abnormal  R Positive Abnormal  R Negative R    Cannabinoids Screen, Ur  Negative Negative Positive Abnormal  R Negative R Negative R Positive Abnormal  R Positive Abnormal  R Negative R    Methadone Screen, Urine  Negative Negative Negative R Negative R Negative R Negative R Negative R Negative R    Cocaine(Metab.)Screen, Urine  Negative Positive Abnormal  Positive Abnormal  R Positive Abnormal  R Positive Abnormal  R Positive Abnormal  R Positive Abnormal  R Positive Abnormal  R    Opiates Screen, Ur  Negative Negative Negative R Negative R Negative R Negative R Negative R Negative R    Oxycodone Screen, Ur  Negative Negative Negative R Positive Abnormal  R Negative R Negative R Negative R Negative R    Buprenorphine, Urine  Negative Positive Abnormal  Positive Abnormal  R Positive Abnormal  R Positive Abnormal  R Positive Abnormal  R Positive Abnormal  R Positive Abnormal  R   Resulting Agency [facility] [place] [facility] [place] [facility] [place] [facility]

## 2023-07-18 MED ORDER — NITROGLYCERIN 0.4 MG SUBLINGUAL TABLET
ORAL_TABLET | SUBLINGUAL | 0 refills | 1 days | Status: CP | PRN
Start: 2023-07-18 — End: ?

## 2023-07-18 MED FILL — ACCU-CHEK GUIDE TEST STRIPS: 30 days supply | Qty: 50 | Fill #1

## 2023-07-18 MED FILL — CLOPIDOGREL 75 MG TABLET: ORAL | 30 days supply | Qty: 30 | Fill #1

## 2023-07-18 MED FILL — CARVEDILOL 25 MG TABLET: ORAL | 30 days supply | Qty: 60 | Fill #1

## 2023-07-18 MED FILL — ACCU-CHEK SOFTCLIX LANCETS: 30 days supply | Qty: 100 | Fill #1

## 2023-07-18 MED FILL — METFORMIN ER 500 MG TABLET,EXTENDED RELEASE 24 HR: ORAL | 30 days supply | Qty: 60 | Fill #1

## 2023-07-18 MED FILL — ROSUVASTATIN 20 MG TABLET: ORAL | 30 days supply | Qty: 30 | Fill #1

## 2023-07-18 MED FILL — ISOSORBIDE MONONITRATE ER 30 MG TABLET,EXTENDED RELEASE 24 HR: ORAL | 30 days supply | Qty: 30 | Fill #1

## 2023-07-18 MED FILL — TRAZODONE 50 MG TABLET: ORAL | 30 days supply | Qty: 30 | Fill #1

## 2023-07-18 MED FILL — CLONIDINE HCL 0.1 MG TABLET: ORAL | 30 days supply | Qty: 60 | Fill #1

## 2023-08-06 DIAGNOSIS — E1169 Type 2 diabetes mellitus with other specified complication: Principal | ICD-10-CM

## 2023-08-06 MED ORDER — LANCETS
11 refills | 0 days | Status: CP
Start: 2023-08-06 — End: 2024-08-05

## 2023-08-06 MED ORDER — TRULICITY 1.5 MG/0.5 ML SUBCUTANEOUS PEN INJECTOR
SUBCUTANEOUS | 3 refills | 84 days | Status: CP
Start: 2023-08-06 — End: ?

## 2023-08-06 MED ORDER — ALBUTEROL SULFATE HFA 90 MCG/ACTUATION AEROSOL INHALER
Freq: Four times a day (QID) | RESPIRATORY_TRACT | 1 refills | 25 days | Status: CP | PRN
Start: 2023-08-06 — End: ?

## 2023-08-06 MED ORDER — METFORMIN ER 500 MG TABLET,EXTENDED RELEASE 24 HR
ORAL_TABLET | Freq: Every day | ORAL | 3 refills | 90 days | Status: CP
Start: 2023-08-06 — End: 2024-08-05

## 2023-08-06 MED ORDER — CARVEDILOL 25 MG TABLET
ORAL_TABLET | Freq: Two times a day (BID) | ORAL | 3 refills | 90 days | Status: CP
Start: 2023-08-06 — End: ?

## 2023-08-06 MED ORDER — DICLOFENAC 1 % TOPICAL GEL
Freq: Four times a day (QID) | TOPICAL | 10 refills | 0 days
Start: 2023-08-06 — End: 2024-08-05

## 2023-08-06 MED ORDER — CLONIDINE HCL 0.1 MG TABLET
ORAL_TABLET | Freq: Two times a day (BID) | ORAL | 3 refills | 90 days | Status: CP
Start: 2023-08-06 — End: 2024-08-05

## 2023-08-06 MED ORDER — OMEPRAZOLE 20 MG CAPSULE,DELAYED RELEASE
ORAL_CAPSULE | Freq: Every day | ORAL | 3 refills | 90 days | Status: CP
Start: 2023-08-06 — End: 2024-08-05

## 2023-08-06 MED ORDER — ROSUVASTATIN 20 MG TABLET
ORAL_TABLET | Freq: Every evening | ORAL | 3 refills | 90 days | Status: CP
Start: 2023-08-06 — End: 2024-08-05

## 2023-08-06 MED ORDER — BLOOD GLUCOSE TEST STRIPS
ORAL_STRIP | 11 refills | 0 days | Status: CP
Start: 2023-08-06 — End: 2024-08-05

## 2023-08-06 MED ORDER — NITROGLYCERIN 0.4 MG SUBLINGUAL TABLET
ORAL_TABLET | SUBLINGUAL | 10 refills | 0 days | PRN
Start: 2023-08-06 — End: ?

## 2023-08-06 MED ORDER — ISOSORBIDE MONONITRATE ER 30 MG TABLET,EXTENDED RELEASE 24 HR
ORAL_TABLET | Freq: Every day | ORAL | 3 refills | 90 days | Status: CP
Start: 2023-08-06 — End: 2024-08-05

## 2023-08-06 MED ORDER — ASPIRIN 81 MG CHEWABLE TABLET
ORAL_TABLET | Freq: Every day | ORAL | 3 refills | 100 days | Status: CP
Start: 2023-08-06 — End: ?

## 2023-08-06 MED ORDER — LISINOPRIL 20 MG TABLET
ORAL_TABLET | Freq: Every day | ORAL | 3 refills | 90 days | Status: CP
Start: 2023-08-06 — End: 2024-08-05

## 2023-08-06 MED ORDER — TRAZODONE 50 MG TABLET
ORAL_TABLET | Freq: Every evening | ORAL | 3 refills | 90 days | Status: CP
Start: 2023-08-06 — End: ?

## 2023-08-06 MED ORDER — CLOPIDOGREL 75 MG TABLET
ORAL_TABLET | Freq: Every day | ORAL | 3 refills | 90 days | Status: CP
Start: 2023-08-06 — End: ?

## 2023-08-06 NOTE — Unmapped (Signed)
Med refill

## 2023-08-07 MED ORDER — DICLOFENAC 1 % TOPICAL GEL
10 refills | 0 days | Status: CP
Start: 2023-08-07 — End: ?

## 2023-08-07 MED ORDER — NITROGLYCERIN 0.4 MG SUBLINGUAL TABLET
ORAL_TABLET | 10 refills | 0 days | Status: CP
Start: 2023-08-07 — End: ?

## 2023-08-07 NOTE — Unmapped (Signed)
[facility] Internal Medicine   EMBEDDED CARE MANAGEMENT OUTREACH ENCOUNTER           Date of Service:  08/07/2023      Service:  Care Coordination - phone  Is there someone else in the room? No.   MyChart use by patient is active:  inconsistant     Post-outreach Action Items:  Provider: N/A.  CM: N/A.  Patient: N/A.    Follow-up Next Call: N/A    Embedded Care Management [facility]) Outreach  Care Manager (CM) completed the following:  Reviewed chart  Identified following:   11/13- telemedicine appt with [facility] clinic -  - Benefits of treatment outweigh the risks  - Utox history reviewed, discussed.   - PDMP reviewed: Appropriate on review today  - Needs naloxone?no  - Next appt: 08/20/23 at 3:05   Polysubstance use disorder  Eager for a change  She is moving up on the housing list  I encourage residential substance use treatment, she would like to do this and wants options.   05/28/23 PCP appt -  Low back pain  Fall 6 mo ago  Worsened back pain since then  Xray lumbar  We reveiwed warning signs of low back pain including increasing severity, weakness in legs, saddle anesthesia or bowel/bladder incontinence   HTN  Cont current regimen  Home bp cuff ordered  Placed call to [name] regarding enrollment in ECM.   The Internal Medicine Enhanced Care team was unable to reach [place] to follow-up with the patient in regards to the Chronic Care Management program. We have made four attempts to contact patient. A voicemail was left with a request to return our call.   CM attempted to reach pt by phone on 11/4, 11/5 and mailed a letter asking for pt to call the clinic if she was interested in participating in the program on 07/17/23. Final attempt to call on 08/07/23 and was only able to leave another message asking for pt to call the clinic if she was interested in program. It has been one month of trying to reach pt without success. Will alert CM management and providers.     Future Appointments   Date Time Provider Department Center   08/20/2023  3:05 PM Jeannette Corpus, MD [facility]   10/16/2023  3:10 PM Leda Min, PA [facility]       A copy of this Patient Outreach Encounter was sent to patient's Primary Care Provider

## 2023-08-14 NOTE — Unmapped (Signed)
Copied from CRM #[id number]. Topic: Access To Clinicians - Req Clinic Call Back  >> Aug 14, 2023  9:31 AM [name] wrote:  The PAC has received an incoming clinical call:    Caller name: Ashley Williamson callback number: [phone]  Relationship to Patient: self  Describe the reason for the call: pt is requesting to speak to either Dr. Shelva Majestic or Randa Ngo about pt getting into a rehab facility because pt states she is ready to be in one.

## 2023-08-14 NOTE — Unmapped (Signed)
[facility] : [facility] Internal Medicine   CARE MANAGEMENT ENCOUNTER           Date of Service:  08/14/2023      Service:  Care Coordination - phone  Is there someone else in the room? No.   MyChart use by patient is active: yes    Post-outreach Action Items:  Provider: N/A.  CM: N/A.  Patient: N/A.    Purpose of contact:     Care Manager (CM) received request for assistance with getting into drug treatment     Care Manager (CM) completed the following related to the above request:  Reviewed chart  Pt has Aetna OON Plan   Pt with managed medicaid   Identified resources   [phone]  [facility] [phone]  [facility] [phone]  Contacted [employer] and discussed the following    Pt states she would like to enter treatment and worries about going to a place she can only stay 4-6 days and then will get kicked out.   Pt prefers CM call with her to options and see if she may can gain admission.   Pt doesn't understand /was not aware she had an Midwife. Thought she only had medicaid   Phone call with pt on three way call to [facility]   Asked for phone consult for inpatient evaluation for SA treatment /detox  Discussed with gentleman who inquired about insurance and then asked about pt's drug of choice   Pt voices to intake she needs treatment from crack/cocaine  Gentleman advised they do not treat opiates and call was ended  Pt then told CM she needed to go buy cigarettes and would call CM back at clinic   CM called [facility] and explained need of pt  Was advised pt would need to likely cancel her [facility] as they do not take this insurance. They have openings and likely could accept pt once they assess her.  Medicaid only pays for detox and pt will need to come up with next step post detox but they have counselors and peer support to help pt with making a plan.   CM called [facility]  They voiced they only accept marketplace medicaid plan and they can also only offer detox for pt .  Pt called the clinic back while CM was on the phone with [facility].  Return call to pt and we discussed [facility]. Pt voices plan to go there tomorrow, does not want to go today, needs to get her things together. CM offered to assist with transport to go to [facility] today and pt declined.  CM advised for pt to go in the AM , not the PM if possible to [facility] and pt voices she will for an assessment/intake  -she is aware she may be asked to cancel her Aet[employer] plan. Pt verbalizes she will not have any issues with this.         Patient was encouraged to reach out to their provider with any questions or concerns.          A copy of this Patient Outreach Encounter was sent to patient's Primary Care Provider

## 2023-08-14 NOTE — Unmapped (Signed)
Inform/Regulatory Low priority, no response needed unless change in care.     I am reaching out to [employer] as part of the community health worker team regarding her  Rehab for Drug Addiction  .    Patient advised to  await call from social worker Lynett Fish  .      Next appt with PCP: N/a     See chart for med list if needed    Sharing communication as part of regulatory requirements.     Ms. Creque reached out to me regarding resources for a rehab program her provider suggested. While reviewing the chart , I was able to see a social worker Lynett Fish had a made attempts to reach patient. I sent Ms .Berle Mull an in [employer] about Ms. Harriman trying to get information on a rehab program. I will call patient back and inform her to await call from MS. Walston.

## 2023-08-15 ENCOUNTER — Ambulatory Visit
Admit: 2023-08-15 | Discharge: 2023-08-16 | Disposition: A | Discharge status: Home / Self Care | Payer: PRIVATE HEALTH INSURANCE | Attending: Student in an Organized Health Care Education/Training Program

## 2023-08-15 DIAGNOSIS — Z139 Encounter for screening, unspecified: Principal | ICD-10-CM

## 2023-08-15 LAB — COMPREHENSIVE METABOLIC PANEL
ALBUMIN: 3.7 g/dL (ref 3.4–5.0)
ALKALINE PHOSPHATASE: 72 U/L (ref 46–116)
ALT (SGPT): 10 U/L (ref 10–49)
ANION GAP: 11 mmol/L (ref 5–14)
AST (SGOT): 15 U/L (ref <=34)
BILIRUBIN TOTAL: 0.5 mg/dL (ref 0.3–1.2)
BLOOD UREA NITROGEN: 8 mg/dL — ABNORMAL LOW (ref 9–23)
BUN / CREAT RATIO: 12
CALCIUM: 9.5 mg/dL (ref 8.7–10.4)
CHLORIDE: 101 mmol/L (ref 98–107)
CO2: 28 mmol/L (ref 20.0–31.0)
CREATININE: 0.66 mg/dL (ref 0.55–1.02)
EGFR CKD-EPI (2021) FEMALE: >90 mL/min/{1.73_m2} (ref >=60)
GLUCOSE RANDOM: 198 mg/dL — ABNORMAL HIGH (ref 70–179)
POTASSIUM: 3.8 mmol/L (ref 3.4–4.8)
PROTEIN TOTAL: 6.7 g/dL (ref 5.7–8.2)
SODIUM: 140 mmol/L (ref 135–145)

## 2023-08-15 LAB — TOXICOLOGY SCREEN, URINE
AMPHETAMINE SCREEN URINE: NEGATIVE
BARBITURATE SCREEN URINE: NEGATIVE
BENZODIAZEPINE SCREEN, URINE: NEGATIVE
BUPRENORPHINE, URINE SCREEN: POSITIVE — AB
CANNABINOID SCREEN URINE: NEGATIVE
COCAINE(METAB.)SCREEN, URINE: POSITIVE — AB
FENTANYL SCREEN, URINE: NEGATIVE
METHADONE SCREEN, URINE: NEGATIVE
OPIATE SCREEN URINE: NEGATIVE
OXYCODONE SCREEN URINE: NEGATIVE

## 2023-08-15 LAB — TSH: THYROID STIMULATING HORMONE: 1.531 u[IU]/mL (ref 0.550–4.780)

## 2023-08-15 LAB — CBC W/ AUTO DIFF
BASOPHILS ABSOLUTE COUNT: 0 10*9/L (ref 0.0–0.1)
BASOPHILS RELATIVE PERCENT: 0.6 %
EOSINOPHILS ABSOLUTE COUNT: 0.1 10*9/L (ref 0.0–0.5)
EOSINOPHILS RELATIVE PERCENT: 0.9 %
HEMATOCRIT: 35.2 % (ref 34.0–44.0)
HEMOGLOBIN: 12.1 g/dL (ref 11.3–14.9)
LYMPHOCYTES ABSOLUTE COUNT: 1.9 10*9/L (ref 1.1–3.6)
LYMPHOCYTES RELATIVE PERCENT: 26.8 %
MEAN CORPUSCULAR HEMOGLOBIN CONC: 34.5 g/dL (ref 32.0–36.0)
MEAN CORPUSCULAR HEMOGLOBIN: 30.8 pg (ref 25.9–32.4)
MEAN CORPUSCULAR VOLUME: 89.4 fL (ref 77.6–95.7)
MEAN PLATELET VOLUME: 8.6 fL (ref 6.8–10.7)
MONOCYTES ABSOLUTE COUNT: 0.5 10*9/L (ref 0.3–0.8)
MONOCYTES RELATIVE PERCENT: 7.3 %
NEUTROPHILS ABSOLUTE COUNT: 4.5 10*9/L (ref 1.8–7.8)
NEUTROPHILS RELATIVE PERCENT: 64.4 %
PLATELET COUNT: 172 10*9/L (ref 150–450)
RED BLOOD CELL COUNT: 3.94 10*12/L — ABNORMAL LOW (ref 3.95–5.13)
RED CELL DISTRIBUTION WIDTH: 12.8 % (ref 12.2–15.2)
WBC ADJUSTED: 6.9 10*9/L (ref 3.6–11.2)

## 2023-08-15 LAB — URINALYSIS WITH MICROSCOPY WITH CULTURE REFLEX PERFORMABLE
BACTERIA: NONE SEEN /HPF
BILIRUBIN UA: NEGATIVE
BLOOD UA: NEGATIVE
GLUCOSE UA: NEGATIVE
HYALINE CASTS: 31 /LPF — ABNORMAL HIGH (ref 0–1)
KETONES UA: 10 — AB
NITRITE UA: NEGATIVE
PH UA: 5 (ref 5.0–9.0)
RBC UA: 6 /HPF — ABNORMAL HIGH (ref <=4)
SPECIFIC GRAVITY UA: 1.02 (ref 1.003–1.030)
SQUAMOUS EPITHELIAL: 5 /HPF (ref 0–5)
UROBILINOGEN UA: 1
WBC UA: 13 /HPF — ABNORMAL HIGH (ref 0–5)

## 2023-08-15 LAB — ETHANOL: ETHANOL: <10 mg/dL (ref <=10)

## 2023-08-15 MED ADMIN — acetaminophen (TYLENOL) tablet 650 mg: 650 mg | ORAL | @ 19:00:00 | Stop: 2023-08-15

## 2023-08-15 NOTE — Unmapped (Signed)
See note from case management

## 2023-08-15 NOTE — Unmapped (Signed)
Patient coming for workup to go to [facility]. Patient uses crack / cocaine
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
## 2023-08-16 NOTE — Unmapped (Signed)
ED Progress Note    Patient accepted by [facility].  She has enough home medications to last 1 to 2 weeks. Patient was provided a sandwich for dinner and is comfortable with plan to discharge at this time.  Taxi called by nursing staff.    Lakeasha Petion PA-C 7:15 PM

## 2023-08-16 NOTE — Unmapped (Signed)
CM paged at this time

## 2023-08-18 ENCOUNTER — Ambulatory Visit
Admit: 2023-08-18 | Discharge: 2023-08-18 | Disposition: A | Discharge status: Home / Self Care | Payer: PRIVATE HEALTH INSURANCE

## 2023-08-18 DIAGNOSIS — I10 Essential (primary) hypertension: Principal | ICD-10-CM

## 2023-08-18 LAB — CBC W/ AUTO DIFF
BASOPHILS ABSOLUTE COUNT: 0.1 10*9/L (ref 0.0–0.1)
BASOPHILS RELATIVE PERCENT: 0.8 %
EOSINOPHILS ABSOLUTE COUNT: 0.1 10*9/L (ref 0.0–0.5)
EOSINOPHILS RELATIVE PERCENT: 1.4 %
HEMATOCRIT: 35.4 % (ref 34.0–44.0)
HEMOGLOBIN: 12.2 g/dL (ref 11.3–14.9)
LYMPHOCYTES ABSOLUTE COUNT: 2.4 10*9/L (ref 1.1–3.6)
LYMPHOCYTES RELATIVE PERCENT: 33.7 %
MEAN CORPUSCULAR HEMOGLOBIN CONC: 34.3 g/dL (ref 32.0–36.0)
MEAN CORPUSCULAR HEMOGLOBIN: 30.5 pg (ref 25.9–32.4)
MEAN CORPUSCULAR VOLUME: 88.8 fL (ref 77.6–95.7)
MEAN PLATELET VOLUME: 8.7 fL (ref 6.8–10.7)
MONOCYTES ABSOLUTE COUNT: 0.5 10*9/L (ref 0.3–0.8)
MONOCYTES RELATIVE PERCENT: 6.5 %
NEUTROPHILS ABSOLUTE COUNT: 4.2 10*9/L (ref 1.8–7.8)
NEUTROPHILS RELATIVE PERCENT: 57.6 %
PLATELET COUNT: 152 10*9/L (ref 150–450)
RED BLOOD CELL COUNT: 3.99 10*12/L (ref 3.95–5.13)
RED CELL DISTRIBUTION WIDTH: 12.9 % (ref 12.2–15.2)
WBC ADJUSTED: 7.2 10*9/L (ref 3.6–11.2)

## 2023-08-18 LAB — COMPREHENSIVE METABOLIC PANEL
ALBUMIN: 3.8 g/dL (ref 3.4–5.0)
ALKALINE PHOSPHATASE: 84 U/L (ref 46–116)
ALT (SGPT): 10 U/L (ref 10–49)
ANION GAP: 11 mmol/L (ref 5–14)
AST (SGOT): 15 U/L (ref <=34)
BILIRUBIN TOTAL: 0.4 mg/dL (ref 0.3–1.2)
BLOOD UREA NITROGEN: 14 mg/dL (ref 9–23)
BUN / CREAT RATIO: 19
CALCIUM: 9.4 mg/dL (ref 8.7–10.4)
CHLORIDE: 104 mmol/L (ref 98–107)
CO2: 29 mmol/L (ref 20.0–31.0)
CREATININE: 0.74 mg/dL (ref 0.55–1.02)
EGFR CKD-EPI (2021) FEMALE: 90 mL/min/{1.73_m2} (ref >=60)
GLUCOSE RANDOM: 145 mg/dL (ref 70–179)
POTASSIUM: 4.1 mmol/L (ref 3.4–4.8)
PROTEIN TOTAL: 7 g/dL (ref 5.7–8.2)
SODIUM: 144 mmol/L (ref 135–145)

## 2023-08-18 NOTE — Unmapped (Signed)
Pt presents from [facility] for needing labs. Has hx of HTN - taken off clonidine. Denies any symptoms. Has some labs that need to be obtained as well.

## 2023-08-18 NOTE — Unmapped (Signed)
Message was left asking if the pt was on omeprazole 20mg  every day and pantoprazole 20 mg both or just one of these meds.  I returned a call that and LVM with pharmacy line that the pt is only on the omeprazole 20 mg a day and to call if questions

## 2023-08-18 NOTE — Unmapped (Signed)
[facility]  Emergency Department Provider Note    ED Clinical Impression     Final diagnoses:   Hypertension, unspecified type (Primary)     HPI, ED Course, Assessment and Plan     Initial Clinical Impression:    August 18, 2023 2:02 PM   Ashley Williamson is a 62 y.o. female with past medical history of COPD, CHF, CAD, T2DM, HTN, HLD, chronic hepatitis C, and substance use disorder presenting with hypertension. The patient reports she was sent from [facility] to check her blood pressure and perform lab work before placement after recent hypertension to 227/92. Denies headache or chest pain. The patient reports she did not receive her dose of clonidine last night, but she reports she received a dose of clonidine 3-4 hours ago. She reports she is compliant with carvedilol and lisinopril. Denies emesis, diarrhea, fever, or chest pain.     BP 132/69  - Pulse 66  - Temp 36.4 ??C (97.6 ??F) (Oral)  - Resp 16  - Wt 72.6 kg (160 lb 0.9 oz)  - SpO2 97%  - BMI 28.35 kg/m??     On exam, patient is very well-appearing, in no acute distress.  Vitals are stable, blood pressure 132/69, afebrile and satting appropriate on room air.  Lungs clear to auscultation bilaterally.  Abdomen soft, nondistended, nontender to palpation.  No unilateral leg swelling or edema.    Medical Decision Making    Differential diagnosis includes but is not limited to hypertension in setting of missed medication dosing, hypertensive emergency, electrolyte abnormality, renal dysfunction, among others.  Patient is well-appearing with no symptoms, suspect that her elevated blood pressure was in setting of missing her clonidine last night and her blood pressure today is reassuring.  Endorses only mild headache, no chest pain, low suspicion for hypertensive emergency.  Will check basic labs, anticipate discharge back to [facility] and will specify that patient should receive her clonidine to avoid any rebound hypertension.    Further ED updates and updates to plan as per ED Course below:    ED Course as of 08/18/23 1559   Mon Aug 18, 2023   1558 Patient's labs are reassuring, no electrolyte abnormalities and creatinine at baseline.  At this time, patient medically cleared to return to [facility].  Her vitals here are stable.  I suspect her hypertension was in setting of missing her clonidine dose.  Encouraged patient to continue taking this and I have explicitly documented in her discharge paperwork that she should be allowed to have this at [facility].  Given return precautions, all questions answered, patient discharged in stable condition.     External Records Reviewed: I have reviewed recent and relevant previous record, including: Outpatient notes - 03/26/2023 [facility] Internal Med Office Visit for patient's past medical history    Independent Interpretation of Studies: I have independently interpreted the following studies:  None    Discussion of Management With Other Providers or Support Staff: I discussed the management of this patient with the:  None    Considerations Regarding Disposition/Escalation of Care and Critical Care:  None    Social Determinants that significantly affected care: Substance abuse    Prescription drugs considered but not prescribed: None    Diagnostic tests considered but not performed: None    Social Drivers of Health with Concerns     Tobacco Use: High Risk (08/18/2023)    Patient History     Smoking Tobacco Use: Every Day  Smokeless Tobacco Use: Never     Passive Exposure: Not on file   Interpersonal Safety: Not on file   Physical Activity: Inactive (02/07/2021)    Exercise Vital Sign     Days of Exercise per Week: 0 days     Minutes of Exercise per Session: 0 min   Stress: Stress Concern Present (11/10/2020)    [employer] of Occupational Health - Occupational Stress Questionnaire     Feeling of Stress : Very much   Substance Use: High Risk (04/29/2023)    Substance Use     In the past year, how often have you used prescription drugs for non-medical reasons?: Never     In the past year, how often have you used illegal drugs?: Weekly     In the past year, have you used any substance for non-medical reasons?: Yes   Social Connections: Socially Isolated (02/07/2021)    Social Connection and Isolation Panel [NHANES]     Frequency of Communication with Friends and Family: More than three times a week     Frequency of Social Gatherings with Friends and Family: More than three times a week     Attends Religious Services: Never     Database administrator or Organizations: No     Attends Banker Meetings: Never     Marital Status: Divorced   Programmer, applications: High Risk (10/22/2022)    Overall Financial Resource Strain (CARDIA)     Difficulty of Paying Living Expenses: Hard     _____________________________________________________________________    The case was discussed with the attending physician who is in agreement with the above assessment and plan    Past History     PAST MEDICAL HISTORY/PAST SURGICAL HISTORY:   Past Medical History:   Diagnosis Date    CHF (congestive heart failure) (CMS-HCC)     Chronic hepatitis C (CMS-HCC) 05/19/2013    COPD (chronic obstructive pulmonary disease) (CMS-HCC)     Depression     Diabetes mellitus (CMS-HCC)     Financial difficulties     Hyperlipidemia     Hypertension     Infectious viral hepatitis        Past Surgical History:   Procedure Laterality Date    APPENDECTOMY      CHG ANGIO EXTREMITY UNILAT N/A 11/30/2021    Procedure: Peripheral Angiography Diagnostic;  Surgeon: Rosana Hoes, MD;  Location: [facility] CATH;  Service: Cardiology    HAND SURGERY Right 2003    PR CATH PLACE/CORON ANGIO, IMG SUPER/INTERP,W LEFT HEART VENTRICULOGRAPHY N/A 10/24/2020    Procedure: Left Heart Catheterization W Intervention;  Surgeon: Rosana Hoes, MD;  Location: [facility] CATH;  Service: Cardiology       MEDICATIONS:   No current facility-administered medications for this encounter.    Current Outpatient Medications:     albuterol HFA 90 mcg/actuation inhaler, Inhale 2 puffs by mouth every six (6) hours as needed for wheezing., Disp: 8.5 g, Rfl: 1    aspirin 81 MG chewable tablet, Chew 1 tablet (81 mg total) by mouth daily., Disp: 100 tablet, Rfl: 3    blood sugar diagnostic (GLUCOSE BLOOD) Strp, Use to test blood sugar once daily, Disp: 100 strip, Rfl: 11    blood-glucose meter kit, Use as directed to check blood sugar, Disp: 1 each, Rfl: 0    carvedilol (COREG) 25 MG tablet, Take 1 tablet (25 mg total) by mouth Two (2) times a day., Disp: 180 tablet, Rfl: 3  cloNIDine HCL (CATAPRES) 0.1 MG tablet, Take 1 tablet (0.1 mg total) by mouth two (2) times a day., Disp: 180 tablet, Rfl: 3    clopidogrel (PLAVIX) 75 mg tablet, Take 1 tablet (75 mg total) by mouth daily., Disp: 90 tablet, Rfl: 3    COURIERED MED OR SUPPLY, by Other route. Sublocade/brixardi DO NOT DISPENSE TO PATIENT, Disp: 1 each, Rfl: 0    diclofenac sodium (VOLTAREN) 1 % gel, APPLY 2 GRAMS TOPICALLY FOUR TIMES A DAY, Disp: 100 g, Rfl: 10    dulaglutide (TRULICITY) 1.5 mg/0.5 mL PnIj, Inject 0.5 mL (1.5 mg total) under the skin every seven (7) days., Disp: 6 mL, Rfl: 3    isosorbide mononitrate (IMDUR) 30 MG 24 hr tablet, Take 1 tablet (30 mg total) by mouth daily., Disp: 90 tablet, Rfl: 3    lancets Misc, Use to test blood sugar once daily as directed, Disp: 100 each, Rfl: 11    lisinopril (PRINIVIL,ZESTRIL) 20 MG tablet, Take 0.5 tablets (10 mg total) by mouth daily., Disp: 45 tablet, Rfl: 3    metFORMIN (GLUCOPHAGE-XR) 500 MG 24 hr tablet, Take 2 tablets (1,000 mg total) by mouth daily before breakfast., Disp: 180 tablet, Rfl: 3    naloxone (NARCAN) 4 mg nasal spray, Use one spray in either nostril once for known/suspected opioid overdose. May repeat every 2-3 minutes in alternating nostril til EMS arrives, Disp: 2 each, Rfl: PRN    nitroglycerin (NITROSTAT) 0.4 MG SL tablet, DISSOLVE 1 TABLET UNDER THE TONGUE AS NEEDED FOR CHEST PAIN EVERY 5 MINUTES UP TO 3 TIMES. IF NO RELIEF CALL 911., Disp: 25 tablet, Rfl: 10    omeprazole (PRILOSEC) 20 MG capsule, Take 1 capsule (20 mg total) by mouth daily., Disp: 90 capsule, Rfl: 3    rosuvastatin (CRESTOR) 20 MG tablet, Take 1 tablet (20 mg total) by mouth nightly., Disp: 90 tablet, Rfl: 3    traZODone (DESYREL) 50 MG tablet, Take 1/2 tablet to 1 tablets (25-50 mg total) by mouth at bedtime., Disp: 90 tablet, Rfl: 3    ALLERGIES:   Lipitor [atorvastatin]    SOCIAL HISTORY:   Social History     Tobacco Use    Smoking status: Every Day     Current packs/day: 0.50     Average packs/day: 0.5 packs/day for 52.0 years (26.0 ttl pk-yrs)     Types: Cigarettes     Start date: 08/17/1971    Smokeless tobacco: Never   Substance Use Topics    Alcohol use: No       FAMILY HISTORY:  Family History   Problem Relation Age of Onset    Diabetes Mother     Heart failure Mother     Cancer Mother     Heart disease Mother     Stroke Mother     Hypertension Father         Review of Systems     A review of systems was performed and relevant portions were as noted above in HPI     Physical Exam     VITAL SIGNS:    BP 132/69  - Pulse 66  - Temp 36.4 ??C (97.6 ??F) (Oral)  - Resp 16  - Wt 72.6 kg (160 lb 0.9 oz)  - SpO2 97%  - BMI 28.35 kg/m??     Constitutional:   Alert and oriented.   Head:   Normocephalic and atraumatic  Eyes:   Conjunctivae are normal, EOMI, PERRL  ENT:   No  notable congestion, Mucous membranes moist, External ears normal, no notable stridor  Cardiovascular:   Rate as vitals above. Appears warm and well perfused  Respiratory:   Normal respiratory effort. Breath sounds are normal.  Gastrointestinal:   Soft, non-distended, and nontender.   Genitourinary:   Deferred  Musculoskeletal:    Normal range of motion in all extremities. No tenderness or edema noted in B/L lower extremities  Neurologic:   No gross focal neurologic deficits beyond baseline are appreciated.  Skin:   Skin is warm, dry and intact.       Radiology     No orders to display       Labs     Labs Reviewed   CBC W/ DIFFERENTIAL    Narrative:     The following orders were created for panel order CBC w/ Differential.                  Procedure                               Abnormality         Status                                     ---------                               -----------         ------                                     CBC w/ Differential[[id number]]                             Final result                                                 Please view results for these tests on the individual orders.   COMPREHENSIVE METABOLIC PANEL   CBC W/ AUTO DIFF         Pertinent labs & imaging results that were available during my care of the patient were reviewed by me and considered in my medical decision making (see chart for details).    Please note- This chart has been created using [employer]. Chart creation errors have been sought, but may not always be located and such creation errors, especially pronoun confusion, do NOT reflect on the standard of medical care.    Documentation assistance was provided by Candise Bowens, Scribe, on August 18, 2023 at 2:05 PM for Herma Carson, MD.    Documentation assistance was provided by the scribe in my presence.  The documentation recorded by the scribe has been reviewed by me and accurately reflects the services I personally performed.        Olegario Messier, MD  Resident  08/18/23 [phone]

## 2023-08-19 NOTE — Unmapped (Signed)
The PAC has received an incoming clinical call:    Caller name: Tzipporah, Jurick   Best callback number: [phone]  Relationship to Patient:   Describe the reason for the call:     Patient wanted to informed Dr. Shelva Majestic and clinic that she has rescheduled her 08/20/2023 MOUD apt due to her being in the [facility] at this time for detox of cocaine. You can call them at [phone] if you need to speak to her. She does not have her cell at this time.

## 2023-08-19 NOTE — Unmapped (Unsigned)
[facility] Follow-up      Birkley Varas    MOUD agreement last signed: 06/11/2023    Most recent Naloxone [facility]) nasal spray Rx sent: 04/29/2023    Last PDMP Review: 07/16/2023  3:55 PM    Last Opioid Dispensed Provider: Melanee Spry, AGNP    Recent Visits  Date Type Provider Dept   07/16/23 Telemedicine [place], Corrie Mckusick, MD [facility] Internal Medicine [facility]   06/11/23 Office Visit [place], Corrie Mckusick, MD [facility] Internal Medicine [facility]   05/28/23 Telemedicine Leda Min, [place] [facility]     Last Drug Screen Date: 08/15/2023  Urine Toxicology Screen    Lab Results   Component Value Date    AMPHU Negative 08/15/2023    BARBU Negative 08/15/2023    BENZU Negative 08/15/2023    CANNAU Negative 08/15/2023    METHU Negative 08/15/2023    COCAU Positive (A) 08/15/2023    OPIAU Negative 08/15/2023    FENTU Negative 08/15/2023    OXYCOU Negative 08/15/2023    BUPRENORPHIN Positive (A) 08/15/2023        Last Drug Screen Date: Not Found  Opiate Confirmation Test    6-Monoacetylmrph   Date Value Ref Range Status   03/07/2014 <20 ng/mL Final     Morphine Confirm   Date Value Ref Range Status   03/07/2014 <50 ng/mL Final     Hydrocodone Confirm   Date Value Ref Range Status   03/07/2014 <50 ng/mL Final     Hydromorphone Confirm   Date Value Ref Range Status   03/07/2014 <50 ng/mL Final     Oxycodone Confirm   Date Value Ref Range Status   03/07/2014 <50 ng/mL Final     Oxymorphone   Date Value Ref Range Status   03/07/2014 <50 ng/mL Final     Norbuprenorphine   Date Value Ref Range Status   03/07/2014 <5 Negative ng/mL Final     Future Appointments  Date Type Provider Dept   08/20/23 Appointment Jeannette Corpus, MD [facility] Internal Medicine [facility]   10/16/23 Appointment Leda Min, PA [facility] Internal Medicine [facility]

## 2023-08-25 NOTE — Unmapped (Signed)
Pharmacy called again and relayed info that she should only be on omeprazole 20mg 

## 2023-08-27 ENCOUNTER — Ambulatory Visit
Admit: 2023-08-27 | Discharge: 2023-08-27 | Disposition: A | Discharge status: Home / Self Care | Payer: PRIVATE HEALTH INSURANCE

## 2023-08-27 DIAGNOSIS — I10 Essential (primary) hypertension: Principal | ICD-10-CM

## 2023-08-27 DIAGNOSIS — Z760 Encounter for issue of repeat prescription: Principal | ICD-10-CM

## 2023-08-27 MED ORDER — LISINOPRIL 10 MG TABLET
ORAL_TABLET | Freq: Two times a day (BID) | ORAL | 1 refills | 30.00 days | Status: CP
Start: 2023-08-27 — End: 2023-09-26

## 2023-08-27 MED ORDER — CLONIDINE HCL 0.1 MG TABLET
ORAL_TABLET | Freq: Two times a day (BID) | ORAL | 1 refills | 30 days | Status: CP
Start: 2023-08-27 — End: 2023-09-26
  Filled 2023-08-27: qty 14, 7d supply, fill #0

## 2023-08-27 MED ORDER — CARVEDILOL 25 MG TABLET
ORAL_TABLET | Freq: Two times a day (BID) | ORAL | 1 refills | 30.00 days | Status: CP
Start: 2023-08-27 — End: 2023-09-26
  Filled 2023-08-27: qty 14, 7d supply, fill #0

## 2023-08-27 MED ORDER — CLOPIDOGREL 75 MG TABLET
ORAL_TABLET | Freq: Every day | ORAL | 1 refills | 90.00 days | Status: CP
Start: 2023-08-27 — End: 2023-09-16
  Filled 2023-08-27: qty 7, 7d supply, fill #0

## 2023-08-27 MED ORDER — ISOSORBIDE MONONITRATE ER 30 MG TABLET,EXTENDED RELEASE 24 HR
ORAL_TABLET | Freq: Every day | ORAL | 1 refills | 30.00 days | Status: CP
Start: 2023-08-27 — End: 2024-08-26
  Filled 2023-08-27: qty 7, 7d supply, fill #0

## 2023-08-27 MED ADMIN — cloNIDine HCL (CATAPRES) tablet 0.1 mg: .1 mg | ORAL | @ 20:00:00 | Stop: 2023-08-27
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
## 2023-08-27 NOTE — Unmapped (Signed)
From [facility]: out of BP medications (on 4 types) for last 2 days. Triage BP: 215/85. Endorses h/a.

## 2023-09-01 NOTE — Unmapped (Signed)
The PAC has received an incoming clinical call:    Caller name: Sapir Sorell callback number: no number at this time. Can call daughter in law [phone]  Relationship to Patient:   Describe the reason for the call: Patient is calling stating she has admitted herself to a drug rehab facility . She states she needs her meds refill to [facility] pharmacy Address: [address], [place], [place] [address]  Areas served: [facility] and nearby areas  Hours: Closed ? Opens 9:30?AM  Phone: [phone]  States its all meds that where sent on 12/25

## 2023-09-24 ENCOUNTER — Ambulatory Visit: Admit: 2023-09-24 | Payer: PRIVATE HEALTH INSURANCE | Attending: Internal Medicine | Primary: Internal Medicine

## 2023-09-24 NOTE — Unmapped (Unsigned)
[facility] Follow-up      Resa Rinks    Opioid pain medication agreement last signed: 06/11/23    Most recent Naloxone [facility]) nasal spray Rx sent: 04/29/23    Last PDMP Review: 07/16/2023  3:55 PM    Last Opioid Dispensed Provider: Melanee Spry, AGNP    Recent Visits  Date Type Provider Dept   07/16/23 Telemedicine [place], Corrie Mckusick, MD [facility] Internal Medicine [facility]   06/11/23 Office Visit [place], Corrie Mckusick, MD [facility] Internal Medicine [facility]   05/28/23 Telemedicine Leda Min, [place] [facility]         Last Drug Screen Date: 08/15/2023  Urine Toxicology Screen    Lab Results   Component Value Date    AMPHU Negative 08/15/2023    BARBU Negative 08/15/2023    BENZU Negative 08/15/2023    CANNAU Negative 08/15/2023    METHU Negative 08/15/2023    COCAU Positive (A) 08/15/2023    OPIAU Negative 08/15/2023    FENTU Negative 08/15/2023    OXYCOU Negative 08/15/2023    BUPRENORPHIN Positive (A) 08/15/2023        Future Appointments  Date Type Provider Dept   10/16/23 Appointment Leda Min, PA [place] [facility]   Showing future appointments within next 365 days and meeting all other requirements

## 2023-10-16 ENCOUNTER — Ambulatory Visit: Admit: 2023-10-16 | Payer: PRIVATE HEALTH INSURANCE

## 2023-10-27 NOTE — Unmapped (Signed)
 Error

## 2023-10-29 DIAGNOSIS — F1120 Opioid dependence, uncomplicated: Principal | ICD-10-CM

## 2023-10-29 MED ORDER — BUPRENORPHINE 8 MG-NALOXONE 2 MG SUBLINGUAL TABLET
ORAL_TABLET | Freq: Two times a day (BID) | SUBLINGUAL | 0 refills | 8.00 days | Status: CP
Start: 2023-10-29 — End: 2023-11-06
  Filled 2023-10-30: qty 16, 8d supply, fill #0

## 2023-10-29 NOTE — Unmapped (Signed)
 Chart reviewed. Pt has received 2 doses of sublocade, last 1/28, duration of action 1 month (likely during her escalation to higher level of care), will be now at higher risk of overdose if bupe not continued.  We currently don't have capacity to continue sublocade so will bridge bupe until our 3/5 visit. PDMP reviewed.

## 2023-10-29 NOTE — Unmapped (Signed)
 Copied from CRM #[id number]. Topic: Scheduling - Cancel/Reschedule  >> Oct 29, 2023 12:52 PM Loraine Leriche wrote:  The PAC has received an incoming call requesting medication refill/clarification/question:    Caller: Ronia Hazelett  Best callback number: [phone]    Type of request (refill, clarification, question): refill    Name of medication:  buprenorphine-naloxone (SUBOXONE) 8-2 mg sublingual tablet    Is there a preferred amount requested (e.g. 30 pills, 3 months worth - if no leave blank):     Desired pharmacy:   [id number] - [place], [place] - [address] [place] [address] [[id number]]      Does caller request a callback from a nurse to discuss?: no    Jcano  10/29/23

## 2023-10-31 MED ORDER — TRULICITY 1.5 MG/0.5 ML SUBCUTANEOUS PEN INJECTOR
SUBCUTANEOUS | 3 refills | 84.00 days | Status: CP
Start: 2023-10-31 — End: ?

## 2023-10-31 MED ORDER — METFORMIN ER 500 MG TABLET,EXTENDED RELEASE 24 HR
ORAL_TABLET | Freq: Every day | ORAL | 3 refills | 90.00 days | Status: CP
Start: 2023-10-31 — End: 2024-10-30

## 2023-10-31 MED ORDER — ALBUTEROL SULFATE HFA 90 MCG/ACTUATION AEROSOL INHALER
Freq: Four times a day (QID) | RESPIRATORY_TRACT | 1 refills | 25.00 days | Status: CP | PRN
Start: 2023-10-31 — End: ?

## 2023-10-31 MED ORDER — ASPIRIN 81 MG CHEWABLE TABLET
ORAL_TABLET | Freq: Every day | ORAL | 3 refills | 100.00 days | Status: CP
Start: 2023-10-31 — End: ?

## 2023-10-31 MED ORDER — TRAZODONE 50 MG TABLET
ORAL_TABLET | Freq: Every evening | ORAL | 3 refills | 90.00 days | Status: CP
Start: 2023-10-31 — End: ?

## 2023-10-31 NOTE — Unmapped (Signed)
 Med refill

## 2023-11-03 NOTE — Unmapped (Unsigned)
 [facility] Follow-up      Maven Rosander    MOUD agreement last signed: 06/11/2023    Most recent Naloxone [facility]) nasal spray Rx sent: 06/11/2023    Last PDMP Review: 10/29/2023  5:23 PM    Last Opioid Dispensed Provider: Melanee Spry, AGNP    Recent Visits  Date Type Provider Dept   07/16/23 Telemedicine [place], Corrie Mckusick, MD [facility]         05/28/23 Telemedicine Leda Min, [place] [facility]     Last Drug Screen Date: 08/15/2023  Urine Toxicology Screen    Lab Results   Component Value Date    AMPHU Negative 08/15/2023    BARBU Negative 08/15/2023    BENZU Negative 08/15/2023    CANNAU Negative 08/15/2023    METHU Negative 08/15/2023    COCAU Positive (A) 08/15/2023    OPIAU Negative 08/15/2023    FENTU Negative 08/15/2023    OXYCOU Negative 08/15/2023    BUPRENORPHIN Positive (A) 08/15/2023        Last Drug Screen Date: Not Found  Opiate Confirmation Test    6-Monoacetylmrph   Date Value Ref Range Status   03/07/2014 <20 ng/mL Final     Morphine Confirm   Date Value Ref Range Status   03/07/2014 <50 ng/mL Final     Hydrocodone Confirm   Date Value Ref Range Status   03/07/2014 <50 ng/mL Final     Hydromorphone Confirm   Date Value Ref Range Status   03/07/2014 <50 ng/mL Final     Oxycodone Confirm   Date Value Ref Range Status   03/07/2014 <50 ng/mL Final     Oxymorphone   Date Value Ref Range Status   03/07/2014 <50 ng/mL Final     Norbuprenorphine   Date Value Ref Range Status   03/07/2014 <5 Negative ng/mL Final     Future Appointments  Date Type Provider Dept         11/19/23 Appointment Leda Min, PA [place] [facility]   Showing future appointments within next 365 days and meeting all other requirements

## 2023-11-05 ENCOUNTER — Ambulatory Visit: Admit: 2023-11-05 | Payer: MEDICARE | Attending: Internal Medicine | Primary: Internal Medicine
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
## 2023-11-10 NOTE — Unmapped (Addendum)
 Trulicity 1.5MG /0.5ML auto-injectors PA submitted    Key: [id number]    Approved. This drug has been approved. Approved quantity: 2 units per 28 day(s). The drug has been approved from 10/27/2023 to 11/09/2024. Please call the pharmacy to process your prescription claim. Generic or biosimilar substitution may be required when available and preferred on the formulary.  Effective Date: 10/27/2023  Authorization Expiration Date: 11/09/2024

## 2023-11-14 MED ORDER — CARVEDILOL 25 MG TABLET
ORAL_TABLET | Freq: Two times a day (BID) | ORAL | 3 refills | 90.00 days | Status: CP
Start: 2023-11-14 — End: ?

## 2023-11-14 MED ORDER — OMEPRAZOLE 20 MG CAPSULE,DELAYED RELEASE
ORAL_CAPSULE | Freq: Every day | ORAL | 3 refills | 90.00 days | Status: CP
Start: 2023-11-14 — End: 2024-11-13

## 2023-11-14 MED ORDER — CLONIDINE HCL 0.1 MG TABLET
ORAL_TABLET | Freq: Two times a day (BID) | ORAL | 3 refills | 90 days | Status: CP
Start: 2023-11-14 — End: 2024-11-13

## 2023-11-14 NOTE — Unmapped (Signed)
 Med refill

## 2023-12-01 MED ORDER — ALBUTEROL SULFATE HFA 90 MCG/ACTUATION AEROSOL INHALER
11 refills | 0 days
Start: 2023-12-01 — End: ?

## 2023-12-02 MED ORDER — ALBUTEROL SULFATE HFA 90 MCG/ACTUATION AEROSOL INHALER
11 refills | 0 days
Start: 2023-12-02 — End: ?

## 2023-12-02 NOTE — Unmapped (Signed)
 Last ordered: 1 month ago (10/31/2023) by Leda Min, PA     Last refill: 12/01/2023   With 11 refills

## 2023-12-09 MED ORDER — OMEPRAZOLE 20 MG CAPSULE,DELAYED RELEASE
ORAL_CAPSULE | Freq: Every day | ORAL | 1 refills | 90.00 days | Status: CP
Start: 2023-12-09 — End: 2024-12-08

## 2023-12-09 MED ORDER — CARVEDILOL 25 MG TABLET
ORAL_TABLET | Freq: Two times a day (BID) | ORAL | 1 refills | 90.00 days | Status: CP
Start: 2023-12-09 — End: 2024-01-08

## 2023-12-09 MED ORDER — CLONIDINE HCL 0.1 MG TABLET
ORAL_TABLET | Freq: Two times a day (BID) | ORAL | 1 refills | 90.00 days | Status: CP
Start: 2023-12-09 — End: 2024-01-08

## 2023-12-09 MED ORDER — ISOSORBIDE MONONITRATE ER 30 MG TABLET,EXTENDED RELEASE 24 HR
ORAL_TABLET | Freq: Every day | ORAL | 1 refills | 90.00 days | Status: CP
Start: 2023-12-09 — End: 2024-12-08

## 2023-12-09 NOTE — Unmapped (Signed)
 Addended by: Avryl Roehm on: 12/09/2023 04:39 PM     Modules accepted: Orders

## 2023-12-09 NOTE — Unmapped (Signed)
 Copied from CRM #[id number]. Topic: Access To Clinicians - Medication Refill  >> Dec 09, 2023  4:21 PM Bria J wrote:  The PAC has received an incoming call requesting medication refill/clarification/question:    Caller: Bambi Lever- Pharmacists  Best callback number: [phone]    Type of request (refill, clarification, question): Medication refill  Name of medication:   carvedilol (COREG) 25 MG tablet   cloNIDine HCL (CATAPRES) 0.1 MG tablet   omeprazole (PRILOSEC) 20 MG capsule  isosorbide mononitrate (IMDUR) 30 MG 24 hr tablet    Is there a preferred amount requested (e.g. 30 pills, 3 months worth - if no leave blank):   Desired pharmacy:     [facility] - [place] - [address]  [address] [place] [address]  Phone: [phone] Fax: [phone]    Does caller request a callback from a nurse to discuss?: no

## 2024-01-08 NOTE — Unmapped (Signed)
 1x attempt. Need to reschedule MOUD/pcp appointment.      Last office visit:07/16/2023    Jcano  01/08/24

## 2024-03-16 NOTE — Unmapped (Signed)
 2x attempt.LVM to reschedule MOUD.

## 2024-03-18 NOTE — Unmapped (Addendum)
 [email or web address]----- Message from Grant Delton, [place] sent at 03/18/2024 12:36 PM EDT -----  She can be hard to get a hold of but can we call her to schedule return continuity?  Thanks

## 2024-03-19 NOTE — Unmapped (Signed)
 3rd attempt mychart message and recall placed for patient   Ashley Williamson

## 2024-03-19 NOTE — Unmapped (Signed)
 2nd attempt cell phone number disconnect and LVM on home number.

## 2024-04-02 NOTE — Unmapped (Signed)
 This encounter was created in error - please disregard.

## 2024-04-02 NOTE — Unmapped (Signed)
 [facility]   EMBEDDED CARE MANAGEMENT OUTREACH ENCOUNTER           Date of Service:  04/02/2024      Service:  Care Coordination - General      Post-outreach Action Items:  Provider: No/none.  CM: No/none.  Patient: No/none.    Embedded Care Management (ECM) Outreach  Care Manager (CM) completed the following: ECM Episode Unenrollment (Documentation update)     Updates  Patient was un-enrolled from [facility] 12/31/21 however ECM enrollment form and report was not updated   CM made several attempts to re-engage patient in [facility] however patient declined  Care Teams and ECM Enrollment Form have been updated       A copy of this Patient Outreach/ECM Encounter was sent to patient's Primary Care Provider

## 2024-06-30 DIAGNOSIS — E1169 Type 2 diabetes mellitus with other specified complication: Principal | ICD-10-CM

## 2024-06-30 MED ORDER — TRULICITY 1.5 MG/0.5 ML SUBCUTANEOUS PEN INJECTOR
SUBCUTANEOUS | 3 refills | 84.00000 days | Status: CP
Start: 2024-06-30 — End: ?

## 2024-06-30 NOTE — Telephone Encounter (Signed)
 Trulicity  needs a PA per pharmacy

## 2024-07-02 NOTE — Telephone Encounter (Signed)
 PA has been submitted for   dulaglutide  (TRULICITY ) 1.5 mg/0.5 mL PnIj       Approved until 07/01/24 until 09/01/25

## 2024-08-10 ENCOUNTER — Ambulatory Visit: Admitting: Nurse Practitioner

## 2024-08-10 NOTE — Progress Notes (Deleted)
 There were no vitals taken for this visit.   Subjective:    Patient ID: Stephanie Braun, female    DOB: [birth year], 63 y.o.   MRN: [id number]  HPI: Stephanie Braun is a 63 y.o. female  No chief complaint on file.  Patient presents to clinic to establish care with new PCP.  Introduced to publishing rights manager role and practice setting.  All questions answered.  Discussed provider/patient relationship and expectations.  Patient reports a history of ***. Patient denies a history of: Hypertension, Elevated Cholesterol, Diabetes, Thyroid problems, Depression, Anxiety, Neurological problems, and Abdominal problems.   Active Ambulatory Problems    Diagnosis Date Noted   Chest pain 04/27/2016   Acute MI (HCC) 04/28/2016   Resolved Ambulatory Problems    Diagnosis Date Noted   No Resolved Ambulatory Problems   Past Medical History:  Diagnosis Date   Anxiety    CHF (congestive heart failure) (HCC)    Cirrhosis (HCC)    Diabetes mellitus without complication (HCC)    Hep C w/ coma, chronic    Hypertension    Past Surgical History:  Procedure Laterality Date   APPENDECTOMY     CARDIAC CATHETERIZATION N/A 04/29/2016   Procedure: Left Heart Cath and Coronary Angiography;  Surgeon: Vinie DELENA Jude, MD;  Location: [facility] INVASIVE CV LAB;  Service: Cardiovascular;  Laterality: N/A;   HAND SURGERY Right    No family history on file.   Review of Systems  Per HPI unless specifically indicated above     Objective:    There were no vitals taken for this visit.  Wt Readings from Last 3 Encounters:  04/29/16 160 lb (72.6 kg)  10/02/15 160 lb (72.6 kg)    Physical Exam  Results for orders placed or performed during the hospital encounter of 04/27/16  Basic metabolic panel   Collection Time: 04/27/16  3:17 PM  Result Value Ref Range   Sodium 136 135 - 145 mmol/L   Potassium 4.1 3.5 - 5.1 mmol/L   Chloride 106 101 - 111 mmol/L   CO2 23 22 - 32 mmol/L   Glucose, Bld 182 (H) 65 - 99 mg/dL    BUN 16 6 - 20 mg/dL   Creatinine, Ser 9.10 0.44 - 1.00 mg/dL   Calcium 8.9 8.9 - 89.6 mg/dL   GFR calc non Af Amer >60 >60 mL/min   GFR calc Af Amer >60 >60 mL/min   Anion gap 7 5 - 15  CBC   Collection Time: 04/27/16  3:17 PM  Result Value Ref Range   WBC 5.9 3.6 - 11.0 K/uL   RBC 4.27 3.80 - 5.20 MIL/uL   Hemoglobin 13.2 12.0 - 16.0 g/dL   HCT 62.5 64.9 - 52.9 %   MCV 87.6 80.0 - 100.0 fL   MCH 30.9 26.0 - 34.0 pg   MCHC 35.3 32.0 - 36.0 g/dL   RDW 86.8 88.4 - 85.4 %   Platelets 167 150 - 440 K/uL  Troponin I   Collection Time: 04/27/16  3:17 PM  Result Value Ref Range   Troponin I <0.03 <0.03 ng/mL  Brain natriuretic peptide   Collection Time: 04/27/16  3:58 PM  Result Value Ref Range   B Natriuretic Peptide 41.0 0.0 - 100.0 pg/mL  Urine Drug Screen, Qualitative ([facility] only)   Collection Time: 04/27/16  7:06 PM  Result Value Ref Range   Tricyclic, Ur Screen POSITIVE (A) NONE DETECTED   Amphetamines, Ur Screen NONE DETECTED NONE DETECTED  MDMA (Ecstasy)Ur Screen NONE DETECTED NONE DETECTED   Cocaine Metabolite,Ur [place] POSITIVE (A) NONE DETECTED   Opiate, Ur Screen NONE DETECTED NONE DETECTED   Phencyclidine (PCP) Ur S NONE DETECTED NONE DETECTED   Cannabinoid 50 Ng, Ur [place] NONE DETECTED NONE DETECTED   Barbiturates, Ur Screen NONE DETECTED NONE DETECTED   Benzodiazepine, Ur Scrn POSITIVE (A) NONE DETECTED   Methadone Scn, Ur POSITIVE (A) NONE DETECTED  Glucose, capillary   Collection Time: 04/27/16  8:58 PM  Result Value Ref Range   Glucose-Capillary 147 (H) 65 - 99 mg/dL  Hemoglobin J8r   Collection Time: 04/28/16 12:26 AM  Result Value Ref Range   Hgb A1c MFr Bld 6.1 (H) 4.0 - 6.0 %  Lipid panel   Collection Time: 04/28/16 12:26 AM  Result Value Ref Range   Cholesterol 201 (H) 0 - 200 mg/dL   Triglycerides 846 (H) <150 mg/dL   HDL 34 (L) >59 mg/dL   Total CHOL/HDL Ratio 5.9 RATIO   VLDL 31 0 - 40 mg/dL   LDL Cholesterol 863 (H) 0 - 99 mg/dL  Troponin I    Collection Time: 04/28/16 12:26 AM  Result Value Ref Range   Troponin I 1.88 (HH) <0.03 ng/mL  APTT   Collection Time: 04/28/16  3:51 AM  Result Value Ref Range   aPTT 39 (H) 24 - 36 seconds  Protime-INR   Collection Time: 04/28/16  3:51 AM  Result Value Ref Range   Prothrombin Time 13.2 11.4 - 15.2 seconds   INR 1.00   Troponin I   Collection Time: 04/28/16  5:56 AM  Result Value Ref Range   Troponin I 2.85 (HH) <0.03 ng/mL  Glucose, capillary   Collection Time: 04/28/16  7:29 AM  Result Value Ref Range   Glucose-Capillary 111 (H) 65 - 99 mg/dL   Comment 1 Notify RN    Comment 2 Document in Chart   Glucose, capillary   Collection Time: 04/28/16 11:10 AM  Result Value Ref Range   Glucose-Capillary 104 (H) 65 - 99 mg/dL   Comment 1 Notify RN    Comment 2 Document in Chart   Troponin I   Collection Time: 04/28/16 11:29 AM  Result Value Ref Range   Troponin I 5.88 (HH) <0.03 ng/mL  Heparin  level (unfractionated)   Collection Time: 04/28/16 11:29 AM  Result Value Ref Range   Heparin  Unfractionated 0.23 (L) 0.30 - 0.70 IU/mL  CBC   Collection Time: 04/28/16 11:29 AM  Result Value Ref Range   WBC 9.3 3.6 - 11.0 K/uL   RBC 4.24 3.80 - 5.20 MIL/uL   Hemoglobin 13.3 12.0 - 16.0 g/dL   HCT 62.5 64.9 - 52.9 %   MCV 88.4 80.0 - 100.0 fL   MCH 31.3 26.0 - 34.0 pg   MCHC 35.5 32.0 - 36.0 g/dL   RDW 86.9 88.4 - 85.4 %   Platelets 152 150 - 440 K/uL  ECHOCARDIOGRAM COMPLETE   Collection Time: 04/28/16  3:01 PM  Result Value Ref Range   Weight 2,569.6 oz   Height 63 in   BP 141/69 mmHg  Glucose, capillary   Collection Time: 04/28/16  4:10 PM  Result Value Ref Range   Glucose-Capillary 87 65 - 99 mg/dL   Comment 1 Notify RN    Comment 2 Document in Chart   Heparin  level (unfractionated)   Collection Time: 04/28/16  7:43 PM  Result Value Ref Range   Heparin  Unfractionated 0.12 (L) 0.30 - 0.70 IU/mL  Glucose, capillary   Collection Time: 04/28/16  9:02 PM  Result Value  Ref Range   Glucose-Capillary 98 65 - 99 mg/dL   Comment 1 Notify RN    Comment 2 Document in Chart   CBC   Collection Time: 04/29/16  4:07 AM  Result Value Ref Range   WBC 7.0 3.6 - 11.0 K/uL   RBC 4.36 3.80 - 5.20 MIL/uL   Hemoglobin 13.5 12.0 - 16.0 g/dL   HCT 61.3 64.9 - 52.9 %   MCV 88.6 80.0 - 100.0 fL   MCH 31.0 26.0 - 34.0 pg   MCHC 35.0 32.0 - 36.0 g/dL   RDW 86.9 88.4 - 85.4 %   Platelets 109 (L) 150 - 440 K/uL  Heparin  level (unfractionated)   Collection Time: 04/29/16  4:07 AM  Result Value Ref Range   Heparin  Unfractionated 0.25 (L) 0.30 - 0.70 IU/mL  Glucose, capillary   Collection Time: 04/29/16 11:42 AM  Result Value Ref Range   Glucose-Capillary 137 (H) 65 - 99 mg/dL   Comment 1 Notify RN       Assessment & Plan:   Problem List Items Addressed This Visit   None    Follow up plan: No follow-ups on file.

## 2024-09-14 ENCOUNTER — Telehealth: Payer: Self-pay

## 2024-09-14 NOTE — Telephone Encounter (Signed)
 Copied from CRM [id number]. Topic: Appointments - Transfer of Care >> Sep 14, 2024 12:06 PM Antony S wrote: Pt is requesting to transfer FROM: none Pt is requesting to transfer TO: clarissa zafirov Reason for requested transfer: pt request It is the responsibility of the team the patient would like to transfer to (Dr. everlene) to reach out to the patient if for any reason this transfer is not acceptable.

## 2024-09-17 NOTE — Telephone Encounter (Addendum)
 Called mailbox not available.----- Message from Velia Novak, MD sent at 09/16/2024  9:35 AM EST -----  Thanks, Estefana.   Admin team, if you could reach out to pt and verify whether or not they are still following up with us , that would be great.   Estefana, you actually were on the Care Team, and I took you off. But-she will still be affecting our stats. Thanks for bringing this up.   Louise  ----- Message -----  From: Devora Sherrilyn Estefana, MD  Sent: 09/16/2024   8:25 AM EST  To: Grant Belle Delton, [place]; Velia Canales K#    Hi Team,    Ms. Klett is on my uncontrolled DM list given no A1C in a year.  She is not actually my primary care patient and has Brittain listed on care team, not me (I have seen her in the past for MOUD).  I believe [place].     1)  Admin team - can you check with her to confirm she is no longer receiving care with us ?  2) Velia - FYI per division meeting, a case where I have seen patient but am not on care team and they are on my DM registry.     Thank you    Estefana

## 2024-09-27 NOTE — Telephone Encounter (Signed)
 Unfortunately, the admin team is unable to complete this request due to the following reasons: Unable to contact patient     We have attempted to contact this patient, including:  - Giving the patient a call  - Sending a text message  - Sending a MyChart message  As soon as the patient responds we will schedule the visit.

## 2024-10-06 ENCOUNTER — Ambulatory Visit

## 2024-10-12 ENCOUNTER — Ambulatory Visit: Admitting: Family Medicine
